# Patient Record
Sex: Female | Born: 1955 | Hispanic: Yes | Marital: Married
Health system: Midwestern US, Academic
[De-identification: ages and names within clinical notes are randomized; demographics above are authoritative.]

---

## 2018-01-30 ENCOUNTER — Encounter: Admit: 2018-01-30 | Discharge: 2018-01-30 | Payer: No Typology Code available for payment source

## 2018-02-04 ENCOUNTER — Encounter: Admit: 2018-02-04 | Discharge: 2018-02-04 | Payer: No Typology Code available for payment source

## 2018-02-04 ENCOUNTER — Ambulatory Visit: Admit: 2018-02-04 | Discharge: 2018-02-04 | Payer: No Typology Code available for payment source

## 2018-02-04 DIAGNOSIS — E113299 Type 2 diabetes mellitus with mild nonproliferative diabetic retinopathy without macular edema, unspecified eye: Principal | ICD-10-CM

## 2018-02-04 MED ORDER — BEVACIZUMAB (#) 1.25MG/0.05ML INTRAVIT SYR
1.25 mg | Freq: Once | INTRAVITREAL | 0 refills | Status: CP
Start: 2018-02-04 — End: ?
  Administered 2018-02-04: 23:00:00 1.25 mg via INTRAVITREAL

## 2018-02-04 MED ORDER — FLUORESCEIN 500 MG/2 ML (25 %) IV SOLN
2 mL | Freq: Once | INTRAVENOUS | 0 refills | Status: CP
Start: 2018-02-04 — End: ?
  Administered 2018-02-04: 22:00:00 2 mL via INTRAVENOUS

## 2018-02-06 ENCOUNTER — Encounter: Admit: 2018-02-06 | Discharge: 2018-02-06 | Payer: No Typology Code available for payment source

## 2018-02-20 ENCOUNTER — Encounter: Admit: 2018-02-20 | Discharge: 2018-02-20 | Payer: No Typology Code available for payment source

## 2018-02-21 ENCOUNTER — Encounter: Admit: 2018-02-21 | Discharge: 2018-02-21 | Payer: No Typology Code available for payment source

## 2018-02-21 ENCOUNTER — Ambulatory Visit: Admit: 2018-02-21 | Discharge: 2018-02-21 | Payer: No Typology Code available for payment source

## 2018-02-21 DIAGNOSIS — E113411 Type 2 diabetes mellitus with severe nonproliferative diabetic retinopathy with macular edema, right eye: ICD-10-CM

## 2018-02-21 DIAGNOSIS — Z794 Long term (current) use of insulin: ICD-10-CM

## 2018-02-21 DIAGNOSIS — Z79899 Other long term (current) drug therapy: ICD-10-CM

## 2018-02-21 DIAGNOSIS — E113592 Type 2 diabetes mellitus with proliferative diabetic retinopathy without macular edema, left eye: Principal | ICD-10-CM

## 2018-02-21 DIAGNOSIS — M1990 Unspecified osteoarthritis, unspecified site: Secondary | ICD-10-CM

## 2018-02-21 DIAGNOSIS — Z7982 Long term (current) use of aspirin: ICD-10-CM

## 2018-02-21 DIAGNOSIS — Z7984 Long term (current) use of oral hypoglycemic drugs: ICD-10-CM

## 2018-02-21 DIAGNOSIS — E119 Type 2 diabetes mellitus without complications: Secondary | ICD-10-CM

## 2018-02-21 MED ORDER — PHENYLEPHRINE HCL 2.5 % OP DROP
1 [drp] | 0 refills | Status: CP
Start: 2018-02-21 — End: ?
  Administered 2018-02-21: 18:00:00 1 [drp]

## 2018-02-21 MED ORDER — TROPICAMIDE 1 % OP DROP
1 [drp] | 0 refills | Status: CP
Start: 2018-02-21 — End: ?
  Administered 2018-02-21: 18:00:00 1 [drp]

## 2018-02-21 MED ORDER — TETRACAINE HCL (PF) 0.5 % OP DROP
0 refills | Status: DC
Start: 2018-02-21 — End: 2018-02-21
  Administered 2018-02-21: 19:00:00 2 [drp] via OPHTHALMIC

## 2018-02-21 MED ORDER — HYPROMELLOSE 2.5 % OP DROP
0 refills | Status: DC
Start: 2018-02-21 — End: 2018-02-21
  Administered 2018-02-21: 19:00:00 1 [drp] via OPHTHALMIC

## 2018-02-21 MED ORDER — CYCLOPENTOLATE 1 % OP DROP
1 [drp] | 0 refills | Status: CP
Start: 2018-02-21 — End: ?
  Administered 2018-02-21: 18:00:00 1 [drp]

## 2018-02-24 ENCOUNTER — Encounter: Admit: 2018-02-24 | Discharge: 2018-02-24 | Payer: No Typology Code available for payment source

## 2018-02-24 DIAGNOSIS — M1990 Unspecified osteoarthritis, unspecified site: Secondary | ICD-10-CM

## 2018-02-24 DIAGNOSIS — E119 Type 2 diabetes mellitus without complications: Secondary | ICD-10-CM

## 2018-02-26 ENCOUNTER — Encounter: Admit: 2018-02-26 | Discharge: 2018-02-26 | Payer: No Typology Code available for payment source

## 2018-03-04 ENCOUNTER — Encounter: Admit: 2018-03-04 | Discharge: 2018-03-04 | Payer: No Typology Code available for payment source

## 2018-03-04 DIAGNOSIS — E133592 Other specified diabetes mellitus with proliferative diabetic retinopathy without macular edema, left eye: Secondary | ICD-10-CM

## 2018-03-05 DIAGNOSIS — E133592 Other specified diabetes mellitus with proliferative diabetic retinopathy without macular edema, left eye: ICD-10-CM

## 2018-03-13 ENCOUNTER — Encounter: Admit: 2018-03-13 | Discharge: 2018-03-13 | Payer: No Typology Code available for payment source

## 2018-03-14 ENCOUNTER — Encounter: Admit: 2018-03-14 | Discharge: 2018-03-14 | Payer: No Typology Code available for payment source

## 2018-03-14 ENCOUNTER — Ambulatory Visit: Admit: 2018-03-14 | Discharge: 2018-03-14 | Payer: No Typology Code available for payment source

## 2018-03-14 DIAGNOSIS — Z9889 Other specified postprocedural states: Secondary | ICD-10-CM

## 2018-03-14 DIAGNOSIS — Z7984 Long term (current) use of oral hypoglycemic drugs: Secondary | ICD-10-CM

## 2018-03-14 DIAGNOSIS — E119 Type 2 diabetes mellitus without complications: Secondary | ICD-10-CM

## 2018-03-14 DIAGNOSIS — M1990 Unspecified osteoarthritis, unspecified site: Secondary | ICD-10-CM

## 2018-03-14 DIAGNOSIS — Z7982 Long term (current) use of aspirin: Secondary | ICD-10-CM

## 2018-03-14 DIAGNOSIS — Z79899 Other long term (current) drug therapy: Secondary | ICD-10-CM

## 2018-03-14 DIAGNOSIS — E785 Hyperlipidemia, unspecified: Secondary | ICD-10-CM

## 2018-03-14 DIAGNOSIS — E113592 Type 2 diabetes mellitus with proliferative diabetic retinopathy without macular edema, left eye: Secondary | ICD-10-CM

## 2018-03-14 DIAGNOSIS — E133592 Other specified diabetes mellitus with proliferative diabetic retinopathy without macular edema, left eye: ICD-10-CM

## 2018-03-14 MED ORDER — CYCLOPENTOLATE 1 % OP DROP
1 [drp] | OPHTHALMIC | 0 refills | Status: DC
Start: 2018-03-14 — End: 2018-03-14
  Administered 2018-03-14: 19:00:00 1 [drp] via OPHTHALMIC

## 2018-03-14 MED ORDER — HYPROMELLOSE 2.5 % OP DROP
0 refills | Status: DC
Start: 2018-03-14 — End: 2018-03-14
  Administered 2018-03-14: 19:00:00 1 [drp] via OPHTHALMIC

## 2018-03-14 MED ORDER — TETRACAINE HCL (PF) 0.5 % OP DROP
0 refills | Status: DC
Start: 2018-03-14 — End: 2018-03-14
  Administered 2018-03-14: 19:00:00 1 [drp] via OPHTHALMIC

## 2018-03-14 MED ORDER — PHENYLEPHRINE HCL 10 % OP DROP
1 [drp] | Freq: Once | OPHTHALMIC | 0 refills | Status: DC
Start: 2018-03-14 — End: 2018-03-14

## 2018-03-14 MED ORDER — BEVACIZUMAB (#) 1.25MG/0.05ML INTRAVIT SYR
1.25 mg | Freq: Once | INTRAVITREAL | 0 refills | Status: CP
Start: 2018-03-14 — End: ?
  Administered 2018-03-14: 21:00:00 1.25 mg via INTRAVITREAL

## 2018-03-14 MED ORDER — TETRACAINE HCL (PF) 0.5 % OP DROP
1 [drp] | OPHTHALMIC | 0 refills | Status: CP
Start: 2018-03-14 — End: ?
  Administered 2018-03-14: 19:00:00 1 [drp] via OPHTHALMIC

## 2018-03-14 MED ORDER — PHENYLEPHRINE HCL 2.5 % OP DROP
1 [drp] | OPHTHALMIC | 0 refills | Status: CP
Start: 2018-03-14 — End: ?
  Administered 2018-03-14: 19:00:00 1 [drp] via OPHTHALMIC

## 2018-03-14 MED ORDER — ACETAMINOPHEN 500 MG PO TAB
1000 mg | Freq: Once | ORAL | 0 refills | Status: CP
Start: 2018-03-14 — End: ?
  Administered 2018-03-14: 19:00:00 1000 mg via ORAL

## 2018-03-14 MED ORDER — TROPICAMIDE 1 % OP DROP
1 [drp] | OPHTHALMIC | 0 refills | Status: CP
Start: 2018-03-14 — End: ?
  Administered 2018-03-14: 19:00:00 1 [drp] via OPHTHALMIC

## 2018-03-14 MED ORDER — PROPARACAINE 0.5 % OP DROP
1 [drp] | OPHTHALMIC | 0 refills | Status: DC
Start: 2018-03-14 — End: 2018-03-14

## 2018-03-17 ENCOUNTER — Encounter: Admit: 2018-03-17 | Discharge: 2018-03-17 | Payer: No Typology Code available for payment source

## 2018-03-17 DIAGNOSIS — E119 Type 2 diabetes mellitus without complications: Secondary | ICD-10-CM

## 2018-03-17 DIAGNOSIS — M1990 Unspecified osteoarthritis, unspecified site: Secondary | ICD-10-CM

## 2018-06-09 ENCOUNTER — Ambulatory Visit: Admit: 2018-06-09 | Discharge: 2018-06-10 | Payer: No Typology Code available for payment source

## 2018-06-09 ENCOUNTER — Encounter: Admit: 2018-06-09 | Discharge: 2018-06-09 | Payer: No Typology Code available for payment source

## 2018-06-09 DIAGNOSIS — E113512 Type 2 diabetes mellitus with proliferative diabetic retinopathy with macular edema, left eye: Principal | ICD-10-CM

## 2018-06-09 MED ORDER — AFLIBERCEPT 2 MG/0.05 ML INTRAVITREAL SOLN
2 mg | Freq: Once | INTRAVITREAL | 0 refills | Status: CP
Start: 2018-06-09 — End: ?
  Administered 2018-06-09: 17:00:00 2 mg via INTRAVITREAL

## 2018-06-09 NOTE — Progress Notes
There is no height or weight on file to calculate BMI.       OCT:  OD: retinal cystic spaces  OS: ???retinal cystic spaces, +ve srf, separated hyaloid     Optos:  OD: H/MAs, macular exudates  OS: H/MAs, macular exudates, prp laser marks      I discussed diagnosis and plan for intravitreal injection with patient. Risks, benefits and alternatives were discussed with patient. Risk of infection, and signs and symptoms of infection discussed at length with patient. Discussed fluctuating and deteriorating vision over the course of treatment. Patient elects to proceed with injection, informed consent was obtained and all questions were answered.     Injection procedure:    Site:     OD     OS  x   Posterior Sub-Tenon     Subconj     Intravitreous       Pre-injection drops:   Tetracaine 0.5% drops  x    5% Betadine  x    Betadine swab to lids  x    Speculum used     Other        Injection medication:    Concentration  Volume (in mL)    Aflibercept (Eylea)  2 mg / 0.05 mL  x   Ranibizumab (Lucentis)  0.3 mg / 0.05 mL     Ranibizumab (Lucentis)  0.5 mg / 0.05 mL     Bevacizumab (Avastin)  1.25 mg / 0.05 mL     Triamcinolone (Kenalog)  40 mg / mL     Triamcinolone (Triesence)  40 mg / mL     Vancomycin  1 mg / 100 microLiters     Ceftazidime  2 mg / 100 microLiters     Kenalog Triamcinolone 40mg /ml    Ozurdex Dexamethason 0.7mg  implant      Lot #  Expiration date:     Injection Needle:   27 Gauge Needle     30 Gauge Needle  x   Needle supplied with medication       Post-operative examination:   Central retinal artery perfused by vision check X   Intraocular pressure checked by tonopen and found to be ___ mmHg     Intraocular pressure checked by applanation and found to be ___ mmHg         I personally performed the Injection           Assessment and Plan:         Referred in by Dr. Cheron Every at [facility]  ???  PMH:   DM2 for 30 years, taking metformin and another oral medication, no insulin,   Joint problem?  HLD  ???

## 2018-08-07 ENCOUNTER — Encounter: Admit: 2018-08-07 | Discharge: 2018-08-07

## 2018-08-07 DIAGNOSIS — E119 Type 2 diabetes mellitus without complications: Secondary | ICD-10-CM

## 2018-08-07 DIAGNOSIS — E113512 Type 2 diabetes mellitus with proliferative diabetic retinopathy with macular edema, left eye: Secondary | ICD-10-CM

## 2018-08-07 DIAGNOSIS — M1990 Unspecified osteoarthritis, unspecified site: Secondary | ICD-10-CM

## 2018-08-07 MED ORDER — AFLIBERCEPT 2 MG/0.05 ML INTRAVITREAL SOLN
2 mg | Freq: Once | INTRAVITREAL | 0 refills | Status: CP
Start: 2018-08-07 — End: ?
  Administered 2018-08-07: 22:00:00 2 mg via INTRAVITREAL

## 2018-08-08 ENCOUNTER — Ambulatory Visit: Admit: 2018-08-07 | Discharge: 2018-08-08

## 2018-08-08 DIAGNOSIS — E113491 Type 2 diabetes mellitus with severe nonproliferative diabetic retinopathy without macular edema, right eye: Secondary | ICD-10-CM

## 2018-08-08 DIAGNOSIS — Z961 Presence of intraocular lens: Secondary | ICD-10-CM

## 2018-11-11 ENCOUNTER — Ambulatory Visit: Admit: 2018-11-11 | Discharge: 2018-11-12 | Payer: No Typology Code available for payment source

## 2018-11-11 ENCOUNTER — Encounter: Admit: 2018-11-11 | Discharge: 2018-11-11 | Payer: No Typology Code available for payment source

## 2018-11-11 MED ORDER — AFLIBERCEPT 2 MG/0.05 ML INTRAVITREAL SOLN
2 mg | Freq: Once | INTRAVITREAL | 0 refills | Status: CP
Start: 2018-11-11 — End: ?
  Administered 2018-11-11: 14:00:00 2 mg via INTRAVITREAL

## 2018-11-11 NOTE — Progress Notes
Body mass index is 26.52 kg/m?.        OCT:  OD: retinal cystic?spaces  OS: increased?retinal cystic?spaces,?separated hyaloid?      I discussed diagnosis and plan for intravitreal injection with patient. Risks, benefits and alternatives were discussed with patient. Risk of infection, and signs and symptoms of infection discussed at length with patient. Discussed fluctuating and deteriorating vision over the course of treatment. Patient elects to proceed with injection, informed consent was obtained and all questions were answered.     Injection procedure:    Site:     OD     OS  x   Posterior Sub-Tenon     Subconj     Intravitreous       Pre-injection drops:   Tetracaine 0.5% drops  x    5% Betadine  x    Betadine swab to lids  x    Speculum used     Other        Injection medication:    Concentration  Volume (in mL)    Aflibercept (Eylea)  2 mg / 0.05 mL  x   Ranibizumab (Lucentis)  0.3 mg / 0.05 mL     Ranibizumab (Lucentis)  0.5 mg / 0.05 mL     Bevacizumab (Avastin)  1.25 mg / 0.05 mL     Triamcinolone (Kenalog)  40 mg / mL     Triamcinolone (Triesence)  40 mg / mL     Vancomycin  1 mg / 100 microLiters     Ceftazidime  2 mg / 100 microLiters     Kenalog Triamcinolone 40mg /ml    Ozurdex Dexamethason 0.7mg  implant      Lot #  Expiration date:     Injection Needle:   27 Gauge Needle     30 Gauge Needle  x   Needle supplied with medication       Post-operative examination:   Central retinal artery perfused by vision check X   Intraocular pressure checked by tonopen and found to be ___ mmHg     Intraocular pressure checked by applanation and found to be ___ mmHg         I personally performed the Injection           Assessment and Plan:         Referred in by Dr. Cheron Every at [facility] [facility]  ?  PMH:   DM2?for 30 years, taking metformin and another oral medication, no insulin,   Joint problem?  HLD  ?  Medications: Gabapentin, metformin, atorvastatin, aspirin  ?  1.?Diabetic retinopathy  OD: severe ?NPDR OS: PDR + DME. S/p prp, room for fill in laser. Plan for ivE today  Stress importance of glycemic and systemic control.  ?  2. PCIOL?OU  Monitor?    3- Dry eye syndrome OU  AT and warm compresses  ?  Signs and symptoms of retinal tears and retinal detachment were reviewed in details with the patient. Patient was instructed to immediately present for evaluation or seek medical help if increased flashes, increased floaters, any decrease in vision, or curtain in field of vision.  ?  F/u?1?m or sooner prn   Oct, optos

## 2018-12-11 ENCOUNTER — Encounter: Admit: 2018-12-11 | Discharge: 2018-12-11 | Payer: No Typology Code available for payment source

## 2018-12-11 ENCOUNTER — Ambulatory Visit: Admit: 2018-12-11 | Discharge: 2018-12-12 | Payer: No Typology Code available for payment source

## 2018-12-11 DIAGNOSIS — E113512 Type 2 diabetes mellitus with proliferative diabetic retinopathy with macular edema, left eye: Principal | ICD-10-CM

## 2018-12-11 NOTE — Progress Notes
OCT:  OD: retinal cystic?spaces but with relatively preserved foveal contour, overall retinal thinning  OS: improvement in?retinal cystic?spaces, ERM      Optos  OD: DBH, exudates, CWS in macula, MAs, retina all ON, no obvious NV  OS: DBH, exudates, MAs in macula, retina all ON with PRP, no obvious Nv      I discussed diagnosis and plan for intravitreal injection with patient. Risks, benefits and alternatives were discussed with patient. Risk of infection, and signs and symptoms of infection discussed at length with patient. Discussed fluctuating and deteriorating vision over the course of treatment. Patient elects to proceed with injection, informed consent was obtained and all questions were answered.     Injection procedure:    Site:     OD     OS  x   Posterior Sub-Tenon     Subconj     Intravitreous       Pre-injection drops:   Tetracaine 0.5% drops  x    5% Betadine  x    Betadine swab to lids  x    Speculum used     Other        Injection medication:    Concentration  Volume (in mL)    Aflibercept (Eylea)  2 mg / 0.05 mL  x   Ranibizumab (Lucentis)  0.3 mg / 0.05 mL     Ranibizumab (Lucentis)  0.5 mg / 0.05 mL     Bevacizumab (Avastin)  1.25 mg / 0.05 mL     Triamcinolone (Kenalog)  40 mg / mL     Triamcinolone (Triesence)  40 mg / mL     Vancomycin  1 mg / 100 microLiters     Ceftazidime  2 mg / 100 microLiters     Kenalog Triamcinolone 40mg /ml    Ozurdex Dexamethason 0.7mg  implant      Lot #  Expiration date:     Injection Needle:   27 Gauge Needle     30 Gauge Needle  x   Needle supplied with medication       Post-operative examination:   Central retinal artery perfused by vision check X   Intraocular pressure checked by tonopen and found to be ___ mmHg     Intraocular pressure checked by applanation and found to be ___ mmHg              Assessment and Plan:         Referred in by Dr. Cheron Every at [place] [facility]  ?  PMH:   DM2?for 30 years, taking metformin and another oral medication, no insulin,   Joint problem?  HLD  ?  Medications: Gabapentin, metformin, atorvastatin, aspirin  ?  1.?Diabetic retinopathy  OD: severe ?NPDR   OS: PDR + DME. S/p prp  Plan for ivE today  Stress importance of glycemic and systemic control.  ?  2. PCIOL?OU  Monitor?    3- Dry eye syndrome OU  AT and warm compresses  ?  Signs and symptoms of retinal tears and retinal detachment were reviewed in details with the patient. Patient was instructed to immediately present for evaluation or seek medical help if increased flashes, increased floaters, any decrease in vision, or curtain in field of vision.  ?  F/u?3-4?m or sooner prn   Oct, optos      Wonda Amis, MD  Ophthalmology Resident PGY-4  [phone]

## 2019-03-19 ENCOUNTER — Encounter: Admit: 2019-03-19 | Discharge: 2019-03-19 | Payer: No Typology Code available for payment source

## 2019-03-19 ENCOUNTER — Ambulatory Visit: Admit: 2019-03-19 | Discharge: 2019-03-20 | Payer: No Typology Code available for payment source

## 2019-03-19 DIAGNOSIS — M1990 Unspecified osteoarthritis, unspecified site: Secondary | ICD-10-CM

## 2019-03-19 DIAGNOSIS — H04129 Dry eye syndrome of unspecified lacrimal gland: Secondary | ICD-10-CM

## 2019-03-19 DIAGNOSIS — Z961 Presence of intraocular lens: Secondary | ICD-10-CM

## 2019-03-19 DIAGNOSIS — E113512 Type 2 diabetes mellitus with proliferative diabetic retinopathy with macular edema, left eye: Secondary | ICD-10-CM

## 2019-03-19 DIAGNOSIS — E119 Type 2 diabetes mellitus without complications: Secondary | ICD-10-CM

## 2019-03-19 MED ORDER — AFLIBERCEPT 2 MG/0.05 ML INTRAVITREAL SOLN GROUP
2 mg | Freq: Once | INTRAVITREAL | 0 refills | Status: CP
Start: 2019-03-19 — End: ?
  Administered 2019-03-19: 23:00:00 2 mg via INTRAVITREAL

## 2019-03-19 NOTE — Progress Notes
There is no height or weight on file to calculate BMI.        OCT:  OD: retinal cystic?spaces but with relatively preserved foveal contour, overall retinal thinning  OS: increased retinal thickness,  ERM      I discussed diagnosis and plan for intravitreal injection with patient. Risks, benefits and alternatives were discussed with patient. Risk of infection, and signs and symptoms of infection discussed at length with patient. Discussed fluctuating and deteriorating vision over the course of treatment. Patient elects to proceed with injection, informed consent was obtained and all questions were answered.     Injection procedure:    Site:     OD     OS  x   Posterior Sub-Tenon     Subconj     Intravitreous       Pre-injection drops:   Tetracaine 0.5% drops  x    5% Betadine  x    Betadine swab to lids  x    Speculum used     Other        Injection medication:    Concentration  Volume (in mL)    Aflibercept (Eylea)  2 mg / 0.05 mL  x   Ranibizumab (Lucentis)  0.3 mg / 0.05 mL     Ranibizumab (Lucentis)  0.5 mg / 0.05 mL     Bevacizumab (Avastin)  1.25 mg / 0.05 mL     Triamcinolone (Kenalog)  40 mg / mL     Triamcinolone (Triesence)  40 mg / mL     Vancomycin  1 mg / 100 microLiters     Ceftazidime  2 mg / 100 microLiters     Kenalog Triamcinolone 40mg /ml    Ozurdex Dexamethason 0.7mg  implant      Lot #  Expiration date:     Injection Needle:   27 Gauge Needle     30 Gauge Needle  x   Needle supplied with medication       Post-operative examination:   Central retinal artery perfused by vision check X   Intraocular pressure checked by tonopen and found to be ___ mmHg     Intraocular pressure checked by applanation and found to be ___ mmHg         I personally performed the Injection         Assessment and Plan:       DFE    Referred in by Dr. Cheron Every at [place] [facility]  ?  PMH:   DM2?for 30 years, taking metformin and another oral medication, no insulin,   Joint problem?  HLD  ? Medications: Gabapentin, metformin, atorvastatin, aspirin  ?  1.?Diabetic retinopathy  OD: severe ?NPDR   OS: PDR?+ DME. S/p prp  Plan for ivE today  Stress importance of glycemic and systemic control.  ?  2. PCIOL?OU  Monitor?  ?  3- Dry eye syndrome OU  AT and warm compresses  ?  Signs and symptoms of retinal tears and retinal detachment were reviewed in details with the patient. Patient was instructed to immediately present for evaluation or seek medical help if increased flashes, increased floaters, any decrease in vision, or curtain in field of vision.  ?  F/u?1?m or sooner prn   Oct, optos  ?

## 2019-03-20 DIAGNOSIS — E113411 Type 2 diabetes mellitus with severe nonproliferative diabetic retinopathy with macular edema, right eye: Secondary | ICD-10-CM

## 2019-04-20 ENCOUNTER — Ambulatory Visit: Admit: 2019-04-20 | Discharge: 2019-04-20 | Payer: No Typology Code available for payment source

## 2019-04-20 ENCOUNTER — Encounter: Admit: 2019-04-20 | Discharge: 2019-04-20 | Payer: No Typology Code available for payment source

## 2019-04-20 DIAGNOSIS — M1990 Unspecified osteoarthritis, unspecified site: Secondary | ICD-10-CM

## 2019-04-20 DIAGNOSIS — E119 Type 2 diabetes mellitus without complications: Secondary | ICD-10-CM

## 2019-04-20 MED ORDER — AFLIBERCEPT 2 MG/0.05 ML INTRAVITREAL SOLN GROUP
2 mg | Freq: Once | INTRAVITREAL | 0 refills | Status: CP
Start: 2019-04-20 — End: ?
  Administered 2019-04-20: 22:00:00 2 mg via INTRAVITREAL

## 2019-05-21 ENCOUNTER — Ambulatory Visit: Admit: 2019-05-21 | Discharge: 2019-05-22 | Payer: No Typology Code available for payment source

## 2019-05-21 ENCOUNTER — Encounter: Admit: 2019-05-21 | Discharge: 2019-05-21 | Payer: No Typology Code available for payment source

## 2019-05-21 DIAGNOSIS — E119 Type 2 diabetes mellitus without complications: Secondary | ICD-10-CM

## 2019-05-21 DIAGNOSIS — M1990 Unspecified osteoarthritis, unspecified site: Secondary | ICD-10-CM

## 2019-05-21 DIAGNOSIS — E113512 Type 2 diabetes mellitus with proliferative diabetic retinopathy with macular edema, left eye: Principal | ICD-10-CM

## 2019-05-21 MED ORDER — BEVACIZUMAB 1.25MG/0.05ML INTRAVIT SYR
1.25 mg | Freq: Once | INTRAVITREAL | 0 refills | Status: CP
Start: 2019-05-21 — End: ?
  Administered 2019-05-21: 22:00:00 1.25 mg via INTRAVITREAL

## 2019-05-21 NOTE — Progress Notes
There is no height or weight on file to calculate BMI.        OCT:  OD: stable retinal cystic?spaces   OS: decreasing retinal thickness,??ERM      I discussed diagnosis and plan for intravitreal injection with patient. Risks, benefits and alternatives were discussed with patient. Risk of infection, and signs and symptoms of infection discussed at length with patient. Discussed fluctuating and deteriorating vision over the course of treatment. Patient elects to proceed with injection, informed consent was obtained and all questions were answered.     Injection procedure:    Site:     OD     OS  x   Posterior Sub-Tenon     Subconj     Intravitreous       Pre-injection drops:   Tetracaine 0.5% drops  x    5% Betadine  x    Betadine swab to lids  x    Speculum used     Other        Injection medication:    Concentration  Volume (in mL)    Aflibercept (Eylea)  2 mg / 0.05 mL     Ranibizumab (Lucentis)  0.3 mg / 0.05 mL     Ranibizumab (Lucentis)  0.5 mg / 0.05 mL     Bevacizumab (Avastin)  1.25 mg / 0.05 mL  x   Triamcinolone (Kenalog)  40 mg / mL     Triamcinolone (Triesence)  40 mg / mL     Vancomycin  1 mg / 100 microLiters     Ceftazidime  2 mg / 100 microLiters     Kenalog Triamcinolone 40mg /ml    Ozurdex Dexamethason 0.7mg  implant      Lot #  Expiration date:     Injection Needle:   27 Gauge Needle     30 Gauge Needle  x   Needle supplied with medication       Post-operative examination:   Central retinal artery perfused by vision check X   Intraocular pressure checked by tonopen and found to be ___ mmHg     Intraocular pressure checked by applanation and found to be ___ mmHg         I personally performed the Injection           Assessment and Plan:         Referred in by Dr. Cheron Every at [place] [facility]  ?  PMH:   DM2?for 30 years, taking metformin and another oral medication, no insulin,   Joint problem?  HLD  ?  Medications: Gabapentin, metformin, atorvastatin, aspirin  ?  1.?Diabetic retinopathy  OD: severe ?NPDR   OS: PDR?+ DME. S/p prp  Plan for ivA today (patient reports having issues with billing her last ivE injection) (plan for Good Days paperwork today as backup)  Stress importance of glycemic and systemic control.  ?  2. PCIOL?OU  Monitor?  ?  3- Dry eye syndrome OU  AT and warm compresses  ?  Signs and symptoms of retinal tears and retinal detachment were reviewed in details with the patient. Patient was instructed to immediately present for evaluation or seek medical help if increased flashes, increased floaters, any decrease in vision, or curtain in field of vision.  ?  F/u?4-6?m or sooner prn   Oct, optos  ?

## 2019-05-21 NOTE — Telephone Encounter
Notified the pt that we had cancellations and if she'd like to come in early today for her appointment to head to the clinic.

## 2019-11-03 ENCOUNTER — Encounter: Admit: 2019-11-03 | Discharge: 2019-11-03 | Payer: No Typology Code available for payment source

## 2019-11-03 ENCOUNTER — Ambulatory Visit: Admit: 2019-11-03 | Discharge: 2019-11-03 | Payer: No Typology Code available for payment source

## 2019-11-03 DIAGNOSIS — H04129 Dry eye syndrome of unspecified lacrimal gland: Secondary | ICD-10-CM

## 2019-11-03 DIAGNOSIS — Z961 Presence of intraocular lens: Secondary | ICD-10-CM

## 2019-11-03 DIAGNOSIS — E113512 Type 2 diabetes mellitus with proliferative diabetic retinopathy with macular edema, left eye: Secondary | ICD-10-CM

## 2019-11-03 DIAGNOSIS — M1990 Unspecified osteoarthritis, unspecified site: Secondary | ICD-10-CM

## 2019-11-03 DIAGNOSIS — E113411 Type 2 diabetes mellitus with severe nonproliferative diabetic retinopathy with macular edema, right eye: Secondary | ICD-10-CM

## 2019-11-03 DIAGNOSIS — H3562 Retinal hemorrhage, left eye: Secondary | ICD-10-CM

## 2019-11-03 DIAGNOSIS — E119 Type 2 diabetes mellitus without complications: Secondary | ICD-10-CM

## 2019-11-03 MED ORDER — BEVACIZUMAB 1.25MG/0.05ML INTRAVIT SYR
1.25 mg | Freq: Once | INTRAVITREAL | 0 refills | Status: CP
Start: 2019-11-03 — End: ?
  Administered 2019-11-03: 21:00:00 1.25 mg via INTRAVITREAL

## 2019-11-09 ENCOUNTER — Encounter: Admit: 2019-11-09 | Discharge: 2019-11-09 | Payer: No Typology Code available for payment source

## 2019-11-09 NOTE — Telephone Encounter
Nurse tried to call pt regarding laser procedure. Nurse called interpreter. Provided 2 phone numbers for interpreter to call. Interpreter stated first number was out of service and could not be reached. The second number was an invalid number. Pt cannot be reached.

## 2020-02-24 ENCOUNTER — Encounter: Admit: 2020-02-24 | Discharge: 2020-02-24 | Payer: No Typology Code available for payment source

## 2020-02-24 NOTE — Telephone Encounter
Pt's daughter jennifer called to set up laser -adv numbers on file were not in service    Call [place] at [phone] or Harriett Sine at [phone]

## 2020-08-29 ENCOUNTER — Encounter: Admit: 2020-08-29 | Discharge: 2020-08-29 | Payer: No Typology Code available for payment source

## 2020-08-29 NOTE — Telephone Encounter
Received fax about Eylea copay program, Patient was approved eylea copay assistance from 05/12/20-05/11/21. (ID: [id number], Group # [id number], RXBin # [id number])

## 2020-09-06 ENCOUNTER — Ambulatory Visit: Admit: 2020-09-06 | Discharge: 2020-09-06 | Payer: No Typology Code available for payment source

## 2020-09-06 ENCOUNTER — Encounter: Admit: 2020-09-06 | Discharge: 2020-09-06 | Payer: No Typology Code available for payment source

## 2020-09-06 DIAGNOSIS — E113512 Type 2 diabetes mellitus with proliferative diabetic retinopathy with macular edema, left eye: Secondary | ICD-10-CM

## 2020-09-06 DIAGNOSIS — M1990 Unspecified osteoarthritis, unspecified site: Secondary | ICD-10-CM

## 2020-09-06 DIAGNOSIS — H3562 Retinal hemorrhage, left eye: Secondary | ICD-10-CM

## 2020-09-06 DIAGNOSIS — E119 Type 2 diabetes mellitus without complications: Secondary | ICD-10-CM

## 2020-09-06 MED ORDER — BEVACIZUMAB 1.25MG/0.05ML INTRAVIT SYR
1.25 mg | Freq: Once | 0 refills | Status: CP
Start: 2020-09-06 — End: ?
  Administered 2020-09-06: 21:00:00 1.25 mg via INTRAVITREAL

## 2020-09-06 NOTE — Progress Notes
Body mass index is 24.89 kg/m?Marland Kitchen    Ultra widefield pseudo-color fundus photo  OD: clear vitreous, arterial attenuation, scattered H/MAs, no NVD  OS: clear vitreous, arterial attenuation, scattered H/MAs, possible NVD, peripheral laser scars    OCT macula  OD: ERM,recurrent new macular edema, improvement in cystoid macular edema, partial PVD  OS: stable ERM with loss of foveal contour, increased DME, partial PVD       I discussed diagnosis and plan for intravitreal injection with patient. Risks, benefits and alternatives were discussed with patient. Risk of infection, and signs and symptoms of infection discussed at length with patient. Discussed fluctuating and deteriorating vision over the course of treatment. Patient elects to proceed with injection, informed consent was obtained and all questions were answered.     Injection procedure:    Site:     OD  x   OS  x   Posterior Sub-Tenon     Subconj     Intravitreous       Pre-injection drops:   Tetracaine 0.5% drops  x    5% Betadine  x    Betadine swab to lids  x    Speculum used     Other        Injection medication:    Concentration  Volume (in mL)    Aflibercept (Eylea)  2 mg / 0.05 mL     Ranibizumab (Lucentis)  0.3 mg / 0.05 mL     Ranibizumab (Lucentis)  0.5 mg / 0.05 mL     Bevacizumab (Avastin)  1.25 mg / 0.05 mL  xx   Triamcinolone (Kenalog)  40 mg / mL     Triamcinolone (Triesence)  40 mg / mL     Vancomycin  1 mg / 100 microLiters     Ceftazidime  2 mg / 100 microLiters     Kenalog Triamcinolone 40mg /ml    Ozurdex Dexamethason 0.7mg  implant      Lot #  Expiration date:     Injection Needle:   27 Gauge Needle     30 Gauge Needle  xx   Needle supplied with medication       Post-operative examination:   Central retinal artery perfused by vision check XX   Intraocular pressure checked by tonopen and found to be ___ mmHg     Intraocular pressure checked by applanation and found to be ___ mmHg         I personally performed the Injection            Assessment and Plan:    Referred in by Dr. Cheron Every at [place] [facility]  ?  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 S/p prp  Stress importance of glycemic and systemic control.  Recommend ivA?OU today (provided Good Days paperwork today as backup)     2. Vitreous hemorrhage OS  Resolved today  Subhyaloid heme today not involving fovea  Will plan on fill PRP OS (will attempt today if possible)      3. PCIOL?OU  4. PCO OU  Possibly visually significant  Monitor for now    5. Dry eye syndrome OU  AT and warm compresses    Signs and symptoms of retinal tears and retinal detachment were reviewed in details with the patient. Patient was  instructed to immediately present for evaluation or seek medical help if increased flashes, increased floaters, any decrease in vision, or curtain in field of vision.  ?  ?  Alison Stalling, MD  Ophthalmology Resident - PGY-2

## 2020-09-16 ENCOUNTER — Encounter: Admit: 2020-09-16 | Discharge: 2020-09-16 | Payer: No Typology Code available for payment source

## 2020-09-16 DIAGNOSIS — M1990 Unspecified osteoarthritis, unspecified site: Secondary | ICD-10-CM

## 2020-09-16 DIAGNOSIS — E119 Type 2 diabetes mellitus without complications: Secondary | ICD-10-CM

## 2020-09-16 MED ADMIN — TROPICAMIDE 1 % OP DROP [8250]: 1 [drp] | OPHTHALMIC | @ 17:00:00 | Stop: 2020-09-16 | NDC 61314035501

## 2020-09-16 MED ADMIN — ACETAMINOPHEN 500 MG PO TAB [102]: 1000 mg | ORAL | @ 18:00:00 | Stop: 2020-09-16 | NDC 00904673061

## 2020-09-16 MED ADMIN — CYCLOPENTOLATE 1 % OP DROP [2025]: 1 [drp] | OPHTHALMIC | @ 17:00:00 | Stop: 2020-09-16 | NDC 61314039601

## 2020-09-16 MED ADMIN — TETRACAINE HCL (PF) 0.5 % OP DROP [305966]: 1 [drp] | OPHTHALMIC | @ 17:00:00 | Stop: 2020-09-16 | NDC 00065074114

## 2020-09-16 MED ADMIN — PHENYLEPHRINE HCL 2.5 % OP DROP [6246]: 1 [drp] | OPHTHALMIC | @ 17:00:00 | Stop: 2020-09-16 | NDC 17478020102

## 2020-09-18 ENCOUNTER — Encounter: Admit: 2020-09-18 | Discharge: 2020-09-18 | Payer: No Typology Code available for payment source

## 2020-09-18 DIAGNOSIS — M1990 Unspecified osteoarthritis, unspecified site: Secondary | ICD-10-CM

## 2020-09-18 DIAGNOSIS — E119 Type 2 diabetes mellitus without complications: Secondary | ICD-10-CM

## 2020-10-07 ENCOUNTER — Encounter: Admit: 2020-10-07 | Discharge: 2020-10-07 | Payer: No Typology Code available for payment source

## 2020-10-07 ENCOUNTER — Ambulatory Visit: Admit: 2020-10-07 | Discharge: 2020-10-07 | Payer: No Typology Code available for payment source

## 2020-10-07 DIAGNOSIS — E119 Type 2 diabetes mellitus without complications: Secondary | ICD-10-CM

## 2020-10-07 DIAGNOSIS — M1990 Unspecified osteoarthritis, unspecified site: Secondary | ICD-10-CM

## 2020-10-07 DIAGNOSIS — E113512 Type 2 diabetes mellitus with proliferative diabetic retinopathy with macular edema, left eye: Principal | ICD-10-CM

## 2020-10-07 MED ORDER — BEVACIZUMAB 1.25MG/0.05ML INTRAVIT SYR
1.25 mg | Freq: Once | 0 refills | Status: CP
Start: 2020-10-07 — End: ?
  Administered 2020-10-07: 21:00:00 1.25 mg

## 2020-10-07 MED ORDER — BEVACIZUMAB 1.25MG/0.05ML INTRAVIT SYR
1.25 mg | Freq: Once | 0 refills | Status: CP
Start: 2020-10-07 — End: ?

## 2020-10-07 NOTE — Progress Notes
Body mass index is 24.89 kg/m?.        OCT macula  OD: ERM, decreased non center edema, improvement in cystoid macular edema, partial PVD  OS: ERM with loss of foveal contour, decreased  DME, partial PVD      I discussed diagnosis and plan for intravitreal injection with patient. Risks, benefits and alternatives were discussed with patient. Risk of infection, and signs and symptoms of infection discussed at length with patient. Discussed fluctuating and deteriorating vision over the course of treatment. Patient elects to proceed with injection, informed consent was obtained and all questions were answered.     Injection procedure:    Site:     OD     OS  x   Posterior Sub-Tenon     Subconj     Intravitreous       Pre-injection drops:   Tetracaine 0.5% drops  x    5% Betadine  x    Betadine swab to lids  x    Speculum used     Other        Injection medication:    Concentration  Volume (in mL)    Aflibercept (Eylea)  2 mg / 0.05 mL     Ranibizumab (Lucentis)  0.3 mg / 0.05 mL     Ranibizumab (Lucentis)  0.5 mg / 0.05 mL     Bevacizumab (Avastin)  1.25 mg / 0.05 mL  x   Triamcinolone (Kenalog)  40 mg / mL     Triamcinolone (Triesence)  40 mg / mL     Vancomycin  1 mg / 100 microLiters     Ceftazidime  2 mg / 100 microLiters     Kenalog Triamcinolone 40mg /ml    Ozurdex Dexamethason 0.7mg  implant      Lot #  Expiration date:     Injection Needle:   27 Gauge Needle     30 Gauge Needle  x   Needle supplied with medication       Post-operative examination:   Central retinal artery perfused by vision check X   Intraocular pressure checked by tonopen and found to be ___ mmHg     Intraocular pressure checked by applanation and found to be ___ mmHg         I personally performed the Injection           Assessment and Plan:         Referred in by Dr. Cheron Every at [facility]  ?  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  HLD  ?  Medications: Gabapentin, metformin, atorvastatin, aspirin  ?  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 S/p prp  Stress importance of glycemic and systemic control.  Recommend ivA?OS today (provided?Good Days paperwork today as backup)   ?  2. Vitreous hemorrhage OS  Resolved today  Subhyaloid heme today not involving fovea    ?  3. PCIOL?OU  4. PCO OU  Possibly visually significant  Monitor for now  ?  5. Dry eye syndrome OU  AT and warm compresses  ?

## 2020-10-08 ENCOUNTER — Encounter: Admit: 2020-10-08 | Discharge: 2020-10-08 | Payer: No Typology Code available for payment source

## 2020-10-08 MED ORDER — HYDROCORTISONE 1 % TP CREA
Freq: Every day | TOPICAL | 0 refills | 30.00000 days | Status: AC | PRN
Start: 2020-10-08 — End: ?

## 2020-10-08 NOTE — Telephone Encounter
Ophthalmology Progress Note - PGY2    S: Patient underwent intravitreal injection of Avastin OS for PDR + DME yesterday 8/19. Starting last night, she reports that her left eye is red and painful with increased tearing. She noted some thicker discharge from the eye this morning. She feels warm but did not check her temperature at home. No vision changes or new floaters.     She also developed an itchy raised rash on both cheeks starting last night which has worsened and spreading down to her neck. Says she got the same rash after cataract surgery. Says she was outside last evening and may have been near poison ivy.     O:    Near VA:    cc:  OD: 20/60 PH 20/40   OS: 20/40 PH 20/30  Pupils: 4 mm/4 mm, 4+/4+ reactive, No RAPD  Tonopen: 18 OD, 13 OS  CVF: Full to CF OU  Motility: Full OU    External: Erythematous raised papules on bilateral cheeks  Adnexa: MGD OU, erythematous raised rash on L eyelid  Conjunctiva: White/Quiet OD, small SCH inf temp OS   Cornea: Clear OD, trace inf PEE OS  A/C: Deep/Clear OU, no cell or flare, no hypopyon   Iris: Round/Flat OU   Lens: PCIOL OU, PCO OU     Dilated Fundus Exam:   (Dilated with 2.5% Phenylephrine and 1% Tropicamide OU.  Effects last 4-6 hours)  Optic Nerve: 0.3 p/f/s OU  Macula: DBH, hard exudates, MA OU   Vessels: Attenuated OU  Vitreous: No stranding or heme OU, clear view without haze   Periphery: Limited view due to patient cooperation    Labs/Imaging:  No labs or imaging.    A/P:  1.  Surface irritation from betadine after recent intravitreal avastin OS  - No signs of endophthalmitis on exam- no cell/flare, hypopyon, vitreous haze  - Could be due to higher % betadine used during recent injection   - PFAT QID OU   - return precautions provided     2. Contact dermatitis   - Unclear etiology but could be from betadine contact on skin vs gloves used during recent injection vs poison ivy exposure   - Start hydrocortisone cream 1% to lesions     3. Diabetic retinopathy  - Follows with Dr. Elpidio Anis   - OD: severe?NPDR?with DME  - OS: PDR?+ DME- s/p prp and intravitreal avastin     4. Pseudophakia OU  - PCO OU   - continue to monitor     Patient discussed with Dr. Prince Solian (PGY4), to be discussed with Dr. Wilma Flavin, Attending Physician.    If you have any questions, do not hesitate to contact us through the ophthalmology consult pager at [phone].    Aquilla Solian, MD  Ophthalmology PGY-2  Available on [facility] Connect  Pager [phone]    [facility]  [address] [place], [place] [address]  Ph: [phone]

## 2020-11-07 ENCOUNTER — Ambulatory Visit: Admit: 2020-11-07 | Discharge: 2020-11-07 | Payer: No Typology Code available for payment source

## 2020-11-07 ENCOUNTER — Encounter: Admit: 2020-11-07 | Discharge: 2020-11-07 | Payer: No Typology Code available for payment source

## 2020-11-07 DIAGNOSIS — E113411 Type 2 diabetes mellitus with severe nonproliferative diabetic retinopathy with macular edema, right eye: Secondary | ICD-10-CM

## 2020-11-07 DIAGNOSIS — E113512 Type 2 diabetes mellitus with proliferative diabetic retinopathy with macular edema, left eye: Principal | ICD-10-CM

## 2020-11-07 MED ORDER — BEVACIZUMAB 1.25MG/0.05ML INTRAVIT SYR
1.25 mg | Freq: Once | 0 refills | Status: CP
Start: 2020-11-07 — End: ?
  Administered 2020-11-07: 20:00:00 1.25 mg

## 2020-11-07 NOTE — Progress Notes
Body mass index is 24.89 kg/m?.    OCT macula  OD:?ERM, decreased non center edema, improvement in cystoid macular edema, partial PVD  OS:?ERM with loss of foveal contour,?DME nasal fovea, partial PVD  ?      I discussed diagnosis and plan for intravitreal injection with patient. Risks, benefits and alternatives were discussed with patient. Risk of infection, and signs and symptoms of infection discussed at length with patient. Discussed fluctuating and deteriorating vision over the course of treatment. Patient elects to proceed with injection, informed consent was obtained and all questions were answered.     Injection procedure:    Site:     OD  x   OS  x   Posterior Sub-Tenon     Subconj     Intravitreous       Pre-injection drops:   Tetracaine 0.5% drops  x    5% Betadine  x    Betadine swab to lids  x    Speculum used     Other        Injection medication:    Concentration  Volume (in mL)    Aflibercept (Eylea)  2 mg / 0.05 mL     Ranibizumab (Lucentis)  0.3 mg / 0.05 mL     Ranibizumab (Lucentis)  0.5 mg / 0.05 mL     Bevacizumab (Avastin)  1.25 mg / 0.05 mL  xx   Triamcinolone (Kenalog)  40 mg / mL     Triamcinolone (Triesence)  40 mg / mL     Vancomycin  1 mg / 100 microLiters     Ceftazidime  2 mg / 100 microLiters     Kenalog Triamcinolone 40mg /ml    Ozurdex Dexamethason 0.7mg  implant      Lot #  Expiration date:     Injection Needle:   27 Gauge Needle     30 Gauge Needle  xx   Needle supplied with medication       Post-operative examination:   Central retinal artery perfused by vision check Xx   Intraocular pressure checked by tonopen and found to be ___ mmHg     Intraocular pressure checked by applanation and found to be ___ mmHg         I personally performed the Injection             Assessment and Plan:           ?  Referred in by Dr. Cheron Every at [facility]  ?  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  HLD  ?  Medications: Gabapentin, metformin, atorvastatin, aspirin  ?  1.?Diabetic retinopathy  OD: severe?NPDR?now with DME  OS: PDR?+ DME. S/p prp  Stress importance of glycemic and systemic control.  Recommend?ivA?OS?today?(provided?Good Days paperwork today as backup)?  ?  2. PCIOL?OU  Possibly visually significant  Monitor for now  ?  3.?Dry eye syndrome OU  AT and warm compresses  ?  Signs and symptoms of retinal tears and retinal detachment were reviewed in details with the patient. Patient was instructed to immediately present for evaluation or seek medical help if increased flashes, increased floaters, any decrease in vision, or curtain in field of vision.    F/u 3-60m or sooner prn  Oct, optos  ?

## 2020-11-09 ENCOUNTER — Ambulatory Visit: Admit: 2020-11-09 | Discharge: 2020-11-09 | Payer: No Typology Code available for payment source

## 2020-11-09 ENCOUNTER — Encounter: Admit: 2020-11-09 | Discharge: 2020-11-09 | Payer: No Typology Code available for payment source

## 2020-11-09 DIAGNOSIS — H1131 Conjunctival hemorrhage, right eye: Secondary | ICD-10-CM

## 2020-11-09 NOTE — Progress Notes
Ophthalmology Clinic    Encounter Date: 11/09/2020    Subjective:    Angela Bradshaw is a 65 y.o. female .  Subjective   Eye Exam (Same Day), Vision Change (Pt received an injection OU Monday of this week but reports OD has been very red, dry and sore since. ), Spots and/or Floaters (Denies ), and Medications Only (Ats prn for comfort)      HPI   Patient presents with:  Eye Exam: Same Day  Vision Change: Pt received an injection OU Monday of this week but reports OD has been very red, dry and sore since.   Spots and/or Floaters: Denies   Medications Only: Ats prn for comfort    Right eye has been more bothersome to pt since injection, less bothersome today. Is also bothered by the redness.       Objective  Base Eye Exam     Visual Acuity (Snellen - Linear)       Right Left    Dist sc 20/40 -2 20/60 +2    Dist ph sc NI NI          Tonometry (iCare Tonometer, 1:16 PM)       Right Left    Pressure 11 13          Pupils       APD    Right None    Left None          Visual Fields       Left Right     Full Full          Neuro/Psych     Oriented x3: Yes    Mood/Affect: Normal          Dilation     Both eyes: 1.0% Tropicamide, 2.5% Phenylephrine @ 1:30 PM            Slit Lamp and Fundus Exam     External Exam       Right Left    External Normal Normal          Slit Lamp Exam       Right Left    Lids/Lashes Normal Normal    Conjunctiva/Sclera Diffuse SCH tr inferotemp SCH    Cornea several diffuse PEE several diffuse PEE    Anterior Chamber Deep and quiet Deep and quiet    Iris Flat Flat    Lens PCIOL with PCO centrally PCIOL with PCO    Vitreous Normal subhyaloid vit hemorrhage           Fundus Exam       Right Left    Disc Sharp, healthy rim     C/D Ratio 0.3     Macula MAs, DBH, hard exudates, edema     Vessels Attenuated     Periphery MAs, DBHs, CWS                      Problem   Conjunctival Hemorrhage of Right Eye       Conjunctival hemorrhage of right eye  sub conj heme after injection  no sign of infection   use PFATs dor comfort   does have PCO OD, may be visually significant  will see back in 1 month          Follow-Up: 1 month     Donata Clay, MD  Staff Ophthalmologist

## 2020-11-09 NOTE — Assessment & Plan Note
sub conj heme after injection  no sign of infection   use PFATs dor comfort   does have PCO OD, may be visually significant  will see back in 1 month

## 2020-12-06 ENCOUNTER — Ambulatory Visit: Admit: 2020-12-06 | Discharge: 2020-12-06 | Payer: No Typology Code available for payment source

## 2020-12-06 ENCOUNTER — Encounter: Admit: 2020-12-06 | Discharge: 2020-12-06 | Payer: No Typology Code available for payment source

## 2020-12-06 DIAGNOSIS — H26491 Other secondary cataract, right eye: Secondary | ICD-10-CM

## 2020-12-06 DIAGNOSIS — H1131 Conjunctival hemorrhage, right eye: Secondary | ICD-10-CM

## 2020-12-06 MED ORDER — PHENYLEPHRINE HCL 2.5 % OP DROP
1 [drp] | Freq: Once | OPHTHALMIC | 0 refills
Start: 2020-12-06 — End: ?

## 2020-12-06 MED ORDER — TROPICAMIDE 1 % OP DROP
1 [drp] | Freq: Once | OPHTHALMIC | 0 refills
Start: 2020-12-06 — End: ?

## 2020-12-06 MED ORDER — TETRACAINE HCL (PF) 0.5 % OP DROP
1 [drp] | OPHTHALMIC | 0 refills
Start: 2020-12-06 — End: ?

## 2020-12-06 NOTE — Assessment & Plan Note
Resolved

## 2020-12-06 NOTE — Progress Notes
Ophthalmology Clinic    Encounter Date: 12/06/2020    Subjective:    Angela Bradshaw is a 65 y.o. female .  Subjective   Eye Exam (Return Pt; conj hem OD ), Vision Change (Pt presents with no changes or concerns OU. Pt reports OD is feeling better. ), Spots and/or Floaters (Denies any new flashes or floaters OU), and Medications Only (ATS prn OU)      HPI   Feel vision has gotten better since last visit 1 month ago.  Still has gritty sensation intermittently, has not felt it this week  Uses Systane Ats and saline drops twice a day and feels like they're helping.  Occasionally uses Wcs and feels like it helps.  Sub conj heme has resolved.  Denies pain and redness           Objective  Base Eye Exam     Visual Acuity (Snellen - Linear)       Right Left    Dist sc 20/50 20/60 +1    Dist ph sc 20/40 +1 20/60 +2          Tonometry (iCare Tonometer, 8:49 AM)       Right Left    Pressure 12 10          Pupils       Pupils Dark APD    Right PERRL 4 None    Left PERRL 4 None          Visual Fields       Left Right     Full Full          Neuro/Psych     Oriented x3: Yes    Mood/Affect: Normal            Slit Lamp and Fundus Exam     External Exam       Right Left    External Normal Normal          Slit Lamp Exam       Right Left    Lids/Lashes Normal Normal    Conjunctiva/Sclera White and quiet tr inferotemp SCH    Cornea few PEE several diffuse PEE    Anterior Chamber Deep and quiet Deep and quiet    Iris Flat Flat    Lens PCIOL with PCO centrally PCIOL with PCO    Vitreous Normal subhyaloid vit hemorrhage                      Problem   Pco (Posterior Capsular Opacification), Right   Conjunctival Hemorrhage of Right Eye       PCO (posterior capsular opacification), right  The patient presents with visually significant posterior capsule opacifications. I am recommending we proceed with YAG capsulotomy. Risks, benefits, and alternatives explained to patient including but not limited to inflammation, retinal detachment, increased intraocular pressure, and loss of vision. The patient understands these risks and agrees to proceed.    Plan for OD only     Conjunctival hemorrhage of right eye  Resolved              Donata Clay, MD  Staff Ophthalmologist

## 2020-12-06 NOTE — Assessment & Plan Note
The patient presents with visually significant posterior capsule opacifications. I am recommending we proceed with YAG capsulotomy. Risks, benefits, and alternatives explained to patient including but not limited to inflammation, retinal detachment, increased intraocular pressure, and loss of vision. The patient understands these risks and agrees to proceed.    Plan for OD only

## 2020-12-08 ENCOUNTER — Encounter: Admit: 2020-12-08 | Discharge: 2020-12-08 | Payer: No Typology Code available for payment source

## 2020-12-08 DIAGNOSIS — M1990 Unspecified osteoarthritis, unspecified site: Secondary | ICD-10-CM

## 2020-12-08 DIAGNOSIS — I10 Essential (primary) hypertension: Secondary | ICD-10-CM

## 2020-12-08 DIAGNOSIS — E119 Type 2 diabetes mellitus without complications: Secondary | ICD-10-CM

## 2020-12-08 MED ADMIN — TROPICAMIDE 1 % OP DROP [8250]: 1 [drp] | OPHTHALMIC | @ 19:00:00 | Stop: 2020-12-08 | NDC 61314035501

## 2020-12-08 MED ADMIN — TETRACAINE HCL (PF) 0.5 % OP DROP [305966]: 1 [drp] | OPHTHALMIC | @ 19:00:00 | Stop: 2020-12-08 | NDC 00065074114

## 2020-12-08 MED ADMIN — ARTIFICIAL TEARS MULTI DOSE GEL GROUP [280012]: 1 [drp] | OPHTHALMIC | @ 19:00:00 | Stop: 2020-12-08 | NDC 00078042947

## 2020-12-08 MED ADMIN — PHENYLEPHRINE HCL 2.5 % OP DROP [6246]: 1 [drp] | OPHTHALMIC | @ 19:00:00 | Stop: 2020-12-08 | NDC 17478020102

## 2020-12-10 ENCOUNTER — Encounter: Admit: 2020-12-10 | Discharge: 2020-12-10 | Payer: No Typology Code available for payment source

## 2020-12-10 DIAGNOSIS — E119 Type 2 diabetes mellitus without complications: Secondary | ICD-10-CM

## 2020-12-10 DIAGNOSIS — I10 Essential (primary) hypertension: Secondary | ICD-10-CM

## 2020-12-10 DIAGNOSIS — M1990 Unspecified osteoarthritis, unspecified site: Secondary | ICD-10-CM

## 2020-12-16 ENCOUNTER — Encounter: Admit: 2020-12-16 | Discharge: 2020-12-16 | Payer: No Typology Code available for payment source

## 2020-12-16 ENCOUNTER — Ambulatory Visit: Admit: 2020-12-16 | Discharge: 2020-12-16 | Payer: No Typology Code available for payment source

## 2020-12-16 DIAGNOSIS — H26491 Other secondary cataract, right eye: Secondary | ICD-10-CM

## 2020-12-16 DIAGNOSIS — E113512 Type 2 diabetes mellitus with proliferative diabetic retinopathy with macular edema, left eye: Secondary | ICD-10-CM

## 2020-12-16 DIAGNOSIS — I10 Essential (primary) hypertension: Secondary | ICD-10-CM

## 2020-12-16 DIAGNOSIS — E119 Type 2 diabetes mellitus without complications: Secondary | ICD-10-CM

## 2020-12-16 DIAGNOSIS — E113411 Type 2 diabetes mellitus with severe nonproliferative diabetic retinopathy with macular edema, right eye: Secondary | ICD-10-CM

## 2020-12-16 DIAGNOSIS — H3562 Retinal hemorrhage, left eye: Secondary | ICD-10-CM

## 2020-12-16 DIAGNOSIS — M1990 Unspecified osteoarthritis, unspecified site: Secondary | ICD-10-CM

## 2020-12-16 NOTE — Progress Notes
Assessment and Plan:    Problem   Pco (Posterior Capsular Opacification), Right       PCO (posterior capsular opacification), right  S/p YAG OD    Capsule open today    Vision down secondary to increased macular edema OU    Needs to see PCP for better blood glucose control    Needs to get into Dr Elpidio Anis for possible injection OU           No follow-ups on file.  [place] E Arnol Mcgibbon, MD  Ophthalmology Resident PGY4  Pager Number [phone]    [place] E Ardel Jagger  [facility] of Ophthalmology      HPI:       Exam:  [facility] Exam     Visual Acuity (Snellen - Linear)       Right Left    Dist sc 20/70 20/70          Tonometry (iCare Tonometer, 10:23 AM)       Right Left    Pressure 16 14          Neuro/Psych     Oriented x3: Yes    Mood/Affect: Normal            Slit Lamp and Fundus Exam     External Exam       Right Left    External Normal Normal          Slit Lamp Exam       Right Left    Lids/Lashes Normal Normal    Conjunctiva/Sclera White and quiet tr inferotemp SCH    Cornea few PEE several diffuse PEE    Anterior Chamber Deep and quiet Deep and quiet    Iris Flat Flat    Lens PCIOL open PC PCIOL with PCO    Anterior Vitreous Normal subhyaloid vit hemorrhage             Refraction     Manifest Refraction (Auto)       Sphere Cylinder Axis Dist VA    Right -1.00 +2.25 008 20/50-2    Left [place] +2.50 109 20/60                OCT MACULA           Macula  Right Eye  Macula findings: Epiretinal membrane, Loss of foveal contour, Intraretinal fluid Macula progression has worsened.     Left Eye  Macula findings: Epiretinal membrane, Loss of foveal contour, Intraretinal fluid Macula progression has worsened.     Notes  Increased CME OU

## 2020-12-16 NOTE — Assessment & Plan Note
S/p YAG OD    Capsule open today    Vision down secondary to increased macular edema OU    Needs to see PCP for better blood glucose control    Needs to get into Dr Elpidio Anis for possible injection OU

## 2021-02-06 ENCOUNTER — Encounter: Admit: 2021-02-06 | Discharge: 2021-02-06 | Payer: No Typology Code available for payment source

## 2021-02-06 ENCOUNTER — Ambulatory Visit: Admit: 2021-02-06 | Discharge: 2021-02-06 | Payer: No Typology Code available for payment source

## 2021-02-06 DIAGNOSIS — I10 Essential (primary) hypertension: Secondary | ICD-10-CM

## 2021-02-06 DIAGNOSIS — E119 Type 2 diabetes mellitus without complications: Secondary | ICD-10-CM

## 2021-02-06 DIAGNOSIS — H4311 Vitreous hemorrhage, right eye: Secondary | ICD-10-CM

## 2021-02-06 DIAGNOSIS — E113513 Type 2 diabetes mellitus with proliferative diabetic retinopathy with macular edema, bilateral: Secondary | ICD-10-CM

## 2021-02-06 DIAGNOSIS — H35373 Puckering of macula, bilateral: Secondary | ICD-10-CM

## 2021-02-06 DIAGNOSIS — M1990 Unspecified osteoarthritis, unspecified site: Secondary | ICD-10-CM

## 2021-02-06 DIAGNOSIS — E113512 Type 2 diabetes mellitus with proliferative diabetic retinopathy with macular edema, left eye: Secondary | ICD-10-CM

## 2021-02-06 DIAGNOSIS — Z961 Presence of intraocular lens: Secondary | ICD-10-CM

## 2021-02-06 MED ORDER — BEVACIZUMAB 1.25MG/0.05ML INTRAVIT SYR
1.25 mg | Freq: Once | 0 refills | Status: CP
Start: 2021-02-06 — End: ?
  Administered 2021-02-06: 23:00:00 1.25 mg

## 2021-02-06 MED ORDER — ACETAMINOPHEN 500 MG PO TAB
1000 mg | ORAL | 0 refills | Status: DC | PRN
Start: 2021-02-06 — End: 2021-02-07
  Administered 2021-02-06: 23:00:00 1000 mg via ORAL

## 2021-02-06 NOTE — Operative Report(Direct Entry)
OPERATIVE REPORT    Name: Angela Bradshaw is a 65 y.o. female     DOB: 06-02-1955             MRN#: [id number]    DATE OF OPERATION: 02/06/2021    Surgeon(s) and Role:     * Anise Salvo, MD - Primary        Preoperative Diagnosis:    Vitreous hemorrhage of right eye (HCC) [H43.11]  PDR OD    Post-op Diagnosis      * Vitreous hemorrhage of right eye (HCC) [H43.11]  PDR OD    Procedure(s) (LRB):  TREATMENT EXTENSIVE/ RETINOPATHY - PHOTOCOAGULATION (Right)     Laser Used: Iridex IQ -577nm-yellow laser  Power/Energy: 300  Number of Spots: 449  Duration: 100  Interval: 150  Lens used: volk 28D  Delivery System: LIO      Estimated Blood Loss:  No blood loss documented.     Specimen(s) Removed/Disposition: * No specimens in log *    Attestation: I performed this procedure without the involvement of a resident.    Complications:  None      Implants: * No implants in log *    Drains: None    Disposition:  PACU - stable    [name], MBBCh  Pager

## 2021-02-06 NOTE — Progress Notes
OCT macula  OD:?ERM, non CI DME,  partial PVD  OS:?ERM with loss of foveal contour,DME nasal fovea  ?  ?  ?  I discussed diagnosis and plan for intravitreal injection with patient. Risks, benefits and alternatives were discussed with patient. Risk of infection, and signs and symptoms of infection discussed at length with patient. Discussed fluctuating and deteriorating vision over the course of treatment. Patient elects to proceed with injection, informed consent was obtained and all questions were answered.     Injection procedure:  ?  Site:   ?  OD  x   OS  x   Posterior Sub-Tenon  ?   Subconj  ?   Intravitreous  ?   ?  Pre-injection drops:   Tetracaine 0.5% drops  x    5% Betadine  x    Betadine swab to lids  x    Speculum used  ?   Other  ?   ?   Injection medication:   ? Concentration  Volume (in mL)    Aflibercept (Eylea)  2 mg / 0.05 mL  ?   Ranibizumab (Lucentis)  0.3 mg / 0.05 mL  ?   Ranibizumab (Lucentis)  0.5 mg / 0.05 mL  ?   Bevacizumab (Avastin)  1.25 mg / 0.05 mL  xx   Triamcinolone (Kenalog)  40 mg / mL  ?   Triamcinolone (Triesence)  40 mg / mL  ?   Vancomycin  1 mg / 100 microLiters  ?   Ceftazidime  2 mg / 100 microLiters  ?   Kenalog Triamcinolone 40mg /ml ?   Ozurdex Dexamethason 0.7mg  implant ?   ?  Lot #  Expiration date:   ?  Injection Needle:   27 Gauge Needle  ?   30 Gauge Needle  xx   Needle supplied with medication  ?   ?  Post-operative examination:   Central retinal artery perfused by vision check Xx   Intraocular pressure checked by tonopen and found to be ___ mmHg  ?   Intraocular pressure checked by applanation and found to be ___ mmHg  ?     ?  I personally performed the Injection  ?  ?  ?  Assessment and Plan:  ?  ?  Referred in by Dr. Cheron Every at [facility]  ?  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  HLD  ?  Medications: Gabapentin, metformin, atorvastatin, aspirin  ?  1.?PDR OU    S/p prp OS  Stress importance of glycemic and systemic control.  Recommend?ivA?OU?today?   Plan for prp OD (today if possible after discussion with the patient and daughter)  ?  2. PCIOL?OU  Possibly visually significant PCO OS  Patient notes considerable glare today  Follow-up with Dr. Nino Parsley    3- Subhyaloid hge OD  Resolving  Stress importance of glycemic and systemic control.  ?  4- ?Dry eye syndrome OU  AT and warm compresses  ?  Signs and symptoms of retinal tears and retinal detachment were reviewed in details with the patient. Patient was instructed to immediately present for evaluation or seek medical help if increased flashes, increased floaters, any decrease in vision, or curtain in field of vision.  ?  F/u 25m or sooner prn  Oct, optos    [place] Jensen, MD  [place] [facility] PGY-2  Available on Voalte/AMS  [phone]    ?

## 2021-02-06 NOTE — H&P (View-Only)
Ophthalmology Preoperative History and Physical Exam - @MYYEAR @    CC/Reason for Surgery: 1- PDR OD  2- vit hge OD    HPI:   1- PDR OD  2- vit hge OD    Past Medical History:  Medical History:   Diagnosis Date   ? Arthritis    ? DM (diabetes mellitus) (HCC)    ? Hypertension         Past Surgical History:  Surgical History:   Procedure Laterality Date   ? TREATMENT EXTENSIVE/ RETINOPATHY - PHOTOCOAGULATION Left 02/21/2018    Performed by Ronnald Collum, MD at [facility] OR   ? TREATMENT EXTENSIVE/ RETINOPATHY - PHOTOCOAGULATION Left 03/14/2018    Performed by Ronnald Collum, MD at [facility] OR   ? TREATMENT EXTENSIVE/ RETINOPATHY - PHOTOCOAGULATION Left 09/16/2020    Performed by Anise Salvo, MD at [facility] OR   ? LASER DISCISSION SECONDARY MEMBRANOUS CATARACT Right 12/08/2020    Performed by Gwendolyn Grant, MD at [facility] OR        Past Ocular History:  PDR OS    Allergies:  No Known Allergies     Social History:  Social History     Socioeconomic History   ? Marital status: Married   Tobacco Use   ? Smoking status: Never   ? Smokeless tobacco: Never   Vaping Use   ? Vaping Use: Never used   Substance and Sexual Activity   ? Alcohol use: Never   ? Drug use: Never        Medications:  No current facility-administered medications for this encounter.    Facility-Administered Medications Ordered in Other Encounters:   ?  bevacizumab (AVASTIN) intravitreal injection 1.25 mg, 1.25 mg, , Once, Arshia Rondon S, MD  ?  bevacizumab (AVASTIN) intravitreal injection 1.25 mg, 1.25 mg, , Once, Jordy Verba S, MD     Family History:  Family History   Family history unknown: Yes        ROS:   Constitutional: WNL   Eyes: See HPI   Ears: WNL   CV: WNL   Resp: WNL   Gastro: WNL   Musculo: WNL   Skin: WNL   Neuro: WNL     Physical Exam:  See nursing intake for vitals    General: No acute distress  HEENT: NC/AT  CV: RRR.  No murmurs/rubs/gallops detected  Resp: CTA Bilaterally  Musculoskeletal: WNL, able to lay flat    Lab Results:  CBC w/Diff    No results found for: WBC, RBC, HGB, HCT, MCV, MCH, MCHC, RDW, PLTCT, MPV No results found for: NEUT, ANC, LYMA, ALC, MONA, AMC, EOSA, AEC, BASA, ABC       Covid-19 Testing:  No results found for: COV1, COV2          Assessment:  1- PDR OD  2- vit hge OD      Plan: To laser room for PRP OD    An extensive discussion took place with the patient concerning the risks, benefits and alternatives to the above procedure. The patient was given the opportunity to have all questions answered. At the conclusion of our discussion, signed informed consent was obtained.     Ronnald Collum, MBBCh   Retina and Vitreous Surgery  Department of Ophthalmology  [facility]      [facility]  [address] [place], [place] [address]  Ph:  [phone]

## 2021-02-06 NOTE — Discharge Instructions - Supplementary Instructions
[facility]  SPECIALTY  SURGERY  CENTER                            LASER  POST-OPERATIVE  INSTRUCTIONS    POST-OPERATIVE  EYE  DROP  SCHEDULE  No Eye Drops Needed  Prednisolone (shake  first)  1 drop  right / left eye      _______times  a  day  _____________________  1 drop  right / left eye      _______times  a  day  HOME  INSTRUCTIONS  Continue all the prescribed medication in your non-operative eye as usual  No eye patch or shield is needed  Resume all of your home medications today, unless instructed otherwise by your doctor  Resume all normal activities  Additional Instructions_________________________________________________________  ____________________________________________________________________________                 WHAT  TO  EXPECT  AFTER  SURGERY:  Your eye may feel irritated, scratchy, or like something is in it  Your eye may appear red or bloodshot, this will lessen as your eye heals  Your vision may be blurry and eyes sensitive to light following the procedure    WHAT TO WATCH / REPORT  FOR  AFTER  SURGERY:  Sudden decrease in vision  New or an increase in flashes of light or floaters  Sudden increase of pain or pain not relieved by over the counter pain medication                     Call the Surgery Center at [phone] or the [facility]         at [phone] (even after hours) for questions, problems or concerns                                                                                                 Patient Signature________________________________Date_________Time____________        Nurse Signature____________________________________

## 2021-02-06 NOTE — Progress Notes
1621: Interpreter services utilized for communication during Intra op and post op procedure and discharge instructions.  # [id number] Matilde Sprang) completed the pt encounter.  Karren Cobble, RN

## 2021-02-06 NOTE — Progress Notes
Pt admitted to preop bay 2 in prep for laser procedure.  Interpreter services utilized # [phone] Matilde Sprang) for communication.  Karren Cobble, RN

## 2021-02-08 ENCOUNTER — Encounter: Admit: 2021-02-08 | Discharge: 2021-02-08 | Payer: No Typology Code available for payment source

## 2021-02-08 DIAGNOSIS — M1990 Unspecified osteoarthritis, unspecified site: Secondary | ICD-10-CM

## 2021-02-08 DIAGNOSIS — E119 Type 2 diabetes mellitus without complications: Secondary | ICD-10-CM

## 2021-02-08 DIAGNOSIS — I10 Essential (primary) hypertension: Secondary | ICD-10-CM

## 2021-08-24 ENCOUNTER — Encounter: Admit: 2021-08-24 | Discharge: 2021-08-24 | Payer: No Typology Code available for payment source

## 2021-09-05 ENCOUNTER — Encounter: Admit: 2021-09-05 | Discharge: 2021-09-05 | Payer: No Typology Code available for payment source

## 2021-09-06 ENCOUNTER — Encounter: Admit: 2021-09-06 | Discharge: 2021-09-06 | Payer: No Typology Code available for payment source

## 2021-09-13 ENCOUNTER — Encounter: Admit: 2021-09-13 | Discharge: 2021-09-13 | Payer: No Typology Code available for payment source

## 2021-09-14 ENCOUNTER — Encounter: Admit: 2021-09-14 | Discharge: 2021-09-14 | Payer: No Typology Code available for payment source

## 2021-09-14 NOTE — Telephone Encounter
Spoke with daughter Kara Mead. Patient has been scheduled. Daughter will be joining her to appt.       Patient currently does not have insurance per daughter. Ph: [phone] financial counselors was provided to pt for further assistance. Voiced understanding and has no further questions.

## 2021-09-21 ENCOUNTER — Encounter: Admit: 2021-09-21 | Discharge: 2021-09-21 | Payer: No Typology Code available for payment source

## 2021-09-21 ENCOUNTER — Emergency Department: Admit: 2021-09-21 | Discharge: 2021-09-21 | Payer: No Typology Code available for payment source

## 2021-09-21 DIAGNOSIS — I10 Essential (primary) hypertension: Secondary | ICD-10-CM

## 2021-09-21 DIAGNOSIS — E119 Type 2 diabetes mellitus without complications: Secondary | ICD-10-CM

## 2021-09-21 DIAGNOSIS — M1990 Unspecified osteoarthritis, unspecified site: Secondary | ICD-10-CM

## 2021-09-21 LAB — URINALYSIS MICROSCOPIC REFLEX TO CULTURE

## 2021-09-21 LAB — LIPASE: LIPASE: 93 U/L — ABNORMAL HIGH (ref 11–82)

## 2021-09-21 LAB — D-DIMER: D-DIMER: 575 ng{FEU}/mL — ABNORMAL HIGH (ref <500)

## 2021-09-21 LAB — CBC AND DIFF
ABSOLUTE BASO COUNT: 0 K/UL (ref 0–0.20)
ABSOLUTE EOS COUNT: 0 K/UL (ref 0–0.45)
ABSOLUTE MONO COUNT: 0.4 K/UL (ref 0–0.80)
MDW (MONOCYTE DISTRIBUTION WIDTH): 18 (ref <20.7)
WBC COUNT: 6.3 K/UL (ref 4.5–11.0)

## 2021-09-21 LAB — URINALYSIS DIPSTICK REFLEX TO CULTURE
LEUKOCYTES: NEGATIVE
NITRITE: NEGATIVE
URINE ASCORBIC ACID, UA: POSITIVE — AB
URINE BILE: NEGATIVE
URINE BLOOD: NEGATIVE
URINE KETONE: NEGATIVE

## 2021-09-21 LAB — COMPREHENSIVE METABOLIC PANEL
ALBUMIN: 4.1 g/dL (ref 3.5–5.0)
ALK PHOSPHATASE: 33 U/L (ref 25–110)
ALT: 9 U/L (ref 7–56)
ANION GAP: 10 K/UL (ref 3–12)
AST: 15 U/L (ref 7–40)
BLD UREA NITROGEN: 23 mg/dL (ref 7–25)
CALCIUM: 9.5 mg/dL (ref 8.5–10.6)
CO2: 25 MMOL/L (ref 21–30)
CREATININE: 1.1 mg/dL — ABNORMAL HIGH (ref 0.4–1.00)
EGFR: 53 mL/min — ABNORMAL LOW (ref >60)
GLUCOSE,PANEL: 72 mg/dL (ref 70–100)
SODIUM: 135 MMOL/L — ABNORMAL LOW (ref 137–147)
TOTAL BILIRUBIN: 0.3 mg/dL (ref 0.3–1.2)
TOTAL PROTEIN: 6.8 g/dL (ref 6.0–8.0)

## 2021-09-21 LAB — PROTIME INR (PT): PROTIME: 10 s (ref 9.5–14.2)

## 2021-09-21 LAB — HIGH SENSITIVITY TROPONIN I 0 HOUR: HIGH SENSITIVITY TROPONIN I 0 HOUR: 4 ng/L — ABNORMAL LOW (ref <12)

## 2021-09-21 LAB — PTT (APTT): PTT: 27 s (ref 24.0–36.5)

## 2021-09-21 LAB — MAGNESIUM: MAGNESIUM: 1.6 mg/dL (ref 1.6–2.6)

## 2021-09-21 LAB — TSH WITH FREE T4 REFLEX: TSH: 2.1 uU/mL (ref 0.35–5.00)

## 2021-09-21 LAB — HIGH SENSITIVITY TROPONIN I 2 HOUR: HIGH SENSITIVITY TROPONIN I 2 HOUR: 3 ng/L — ABNORMAL LOW (ref <12)

## 2021-09-21 MED ORDER — ASPIRIN 325 MG PO TAB
325 mg | Freq: Once | ORAL | 0 refills | Status: CP
Start: 2021-09-21 — End: ?
  Administered 2021-09-22: 04:00:00 325 mg via ORAL

## 2021-09-21 MED ORDER — ACETAMINOPHEN 500 MG PO TAB
1000 mg | Freq: Once | ORAL | 0 refills | Status: CP
Start: 2021-09-21 — End: ?
  Administered 2021-09-22: 04:00:00 1000 mg via ORAL

## 2021-09-22 NOTE — ED Notes
ED Initial Provider Note:    This patient was seen in the ED triage area to initiate and expedite the patients ED care when possible.    ED Chief Complaint:   Chief Complaint   Patient presents with    Shortness of Breath     Over the past few months, increased today w/ chest pain. Patient endorsing anxiety        S: Angela Bradshaw is a 66 y.o. female who presents to the Emergency Department for worsening anxiety, chest pain, SOA over the past few months. Pt's daughter states pt has been complaining of feeling worse and has been decreasing her medicines because she thinks they are making her worse. Endorses decreased appetite, fatigue, and weakness. Reportedly hospitalized for a kidney infection at [facility] last month.     PMHx:  Medical History:   Diagnosis Date    Arthritis     DM (diabetes mellitus) (HCC)     Hypertension        BP 125/58 (BP Source: Arm, Right Upper)  - Pulse 82  - Temp 37.5 C (99.5 F)  - Ht 165.1 cm (5\' 5" )  - Wt 56.7 kg (125 lb)  - SpO2 100%  - BMI 20.80 kg/m    O: Brief Physical: Pt is a&ox3. Appears weak, slightly pale. Lung sounds clear, heart sounds normal.     A/P: The patient was seen by me as an initial provider in triage. A brief history and physical was obtained. My exam is intended to be an initial medial screening exam. Initial orders have been placed by me. My working diagnosis is ACS, hyperglycemia, anxiety, UTI, pyelo.    The patient is deemed appropriate for the main ED. The patient's care will be resumed by the ED provider care team once the patient is roomed in the ED. A more detailed / complete H&P will be documented by those providers.

## 2021-09-22 NOTE — ED Notes
Angela Bradshaw is a 66 yo Spanish speaking woman who came into the ED today with CC of anxiety & burning w/ urination. Per pt, she has had depression and anxiety for quite some time, but it has elevated since a recent admission for a kidney infection this past month. Pt reports since, she has had financial difficulty, has not been working, and requires increased support from family to maintain her home, all of which have escalated her anxiety. Pt denies taking any medication for depression/anxiety. Pt reports SOA and chest tightness d/t anxiety, but denies both at time of this RN's evaluation. Pt reporting decreased urine output, new burning with urination, and increased foul odor to urine for ~3days. Pt denies fever/chills, as well as ever having a UTI in the past. Pt reports she has been told she needs her gallbladder removed, and expressed associated financial concern. Pt denies N/V/D, constipation, abd pain, and/or fevers/chills. Pt is A&Ox4, per daughter, forgetful at bedside. Pt breathing is non-labored, eupneic on RA, skin is appropriate to age and ethnicity. Pt resting in bed, locked in lowest position, side rails up, call light in reach. Pt daughter at bedside.    Belongings: shirt, pants, socks, shoes, undergarments    Medical History:   Diagnosis Date    Arthritis     DM (diabetes mellitus) (HCC)     Hypertension

## 2021-09-22 NOTE — ED Notes
Report to [facility]

## 2021-09-26 ENCOUNTER — Encounter: Admit: 2021-09-26 | Discharge: 2021-09-26

## 2021-11-07 ENCOUNTER — Encounter: Admit: 2021-11-07 | Discharge: 2021-11-07

## 2021-11-14 ENCOUNTER — Encounter: Admit: 2021-11-14 | Discharge: 2021-11-14

## 2021-11-30 ENCOUNTER — Ambulatory Visit: Admit: 2021-11-30 | Discharge: 2021-11-30 | Payer: Medicaid Other

## 2021-11-30 ENCOUNTER — Encounter: Admit: 2021-11-30 | Discharge: 2021-11-30 | Payer: Medicaid Other

## 2021-11-30 DIAGNOSIS — E119 Type 2 diabetes mellitus without complications: Secondary | ICD-10-CM

## 2021-11-30 DIAGNOSIS — E113592 Type 2 diabetes mellitus with proliferative diabetic retinopathy without macular edema, left eye: Secondary | ICD-10-CM

## 2021-11-30 DIAGNOSIS — E113513 Type 2 diabetes mellitus with proliferative diabetic retinopathy with macular edema, bilateral: Secondary | ICD-10-CM

## 2021-11-30 DIAGNOSIS — M1990 Unspecified osteoarthritis, unspecified site: Secondary | ICD-10-CM

## 2021-11-30 DIAGNOSIS — I10 Essential (primary) hypertension: Secondary | ICD-10-CM

## 2021-11-30 DIAGNOSIS — E113512 Type 2 diabetes mellitus with proliferative diabetic retinopathy with macular edema, left eye: Secondary | ICD-10-CM

## 2021-11-30 DIAGNOSIS — H35373 Puckering of macula, bilateral: Secondary | ICD-10-CM

## 2021-11-30 DIAGNOSIS — Z961 Presence of intraocular lens: Secondary | ICD-10-CM

## 2021-11-30 DIAGNOSIS — H04129 Dry eye syndrome of unspecified lacrimal gland: Secondary | ICD-10-CM

## 2021-11-30 MED ORDER — CYCLOPENTOLATE 1 % OP DROP
1 [drp] | OPHTHALMIC | 0 refills | Status: CN
Start: 2021-11-30 — End: ?

## 2021-11-30 MED ORDER — PHENYLEPHRINE HCL 2.5 % OP DROP
1 [drp] | OPHTHALMIC | 0 refills | Status: CN
Start: 2021-11-30 — End: ?

## 2021-11-30 MED ORDER — TROPICAMIDE 1 % OP DROP
1 [drp] | OPHTHALMIC | 0 refills | Status: CP
Start: 2021-11-30 — End: ?
  Administered 2021-11-30: 21:00:00 1 [drp] via OPHTHALMIC

## 2021-11-30 MED ORDER — TETRACAINE HCL (PF) 0.5 % OP DROP
1 [drp] | OPHTHALMIC | 0 refills | Status: DC
Start: 2021-11-30 — End: 2021-11-30
  Administered 2021-11-30: 21:00:00 1 [drp] via OPHTHALMIC

## 2021-11-30 MED ORDER — CYCLOPENTOLATE 1 % OP DROP
1 [drp] | OPHTHALMIC | 0 refills | Status: CP
Start: 2021-11-30 — End: ?
  Administered 2021-11-30: 21:00:00 1 [drp] via OPHTHALMIC

## 2021-11-30 MED ORDER — TROPICAMIDE 1 % OP DROP
1 [drp] | OPHTHALMIC | 0 refills | Status: CN
Start: 2021-11-30 — End: ?

## 2021-11-30 MED ORDER — PHENYLEPHRINE HCL 2.5 % OP DROP
1 [drp] | OPHTHALMIC | 0 refills | Status: CP
Start: 2021-11-30 — End: ?
  Administered 2021-11-30: 21:00:00 1 [drp] via OPHTHALMIC

## 2021-11-30 MED ORDER — TETRACAINE HCL (PF) 0.5 % OP DROP
1 [drp] | OPHTHALMIC | 0 refills | Status: CN
Start: 2021-11-30 — End: ?

## 2021-11-30 NOTE — Progress Notes
Body mass index is 21.26 kg/m.      OCT MACULA             FUNDUS PHOTOGRAPHY, OU-BOTH EYES               OCT macula  OD:ERM, small non CI DME,  partial PVD  OS:ERM with loss of foveal contour, DMEnasal fovea            Assessment and Plan:         Referred in by Dr. Stacie Acres at [facility]    PMH:  DM1for 30 years, taking metformin and another oral medication, no insulin,   Joint problem?  HLD    Medications: Gabapentin, metformin, atorvastatin, aspirin    1.PDR OU  S/p prp OS  Stress importance of glycemic and systemic control.  Plan for prp OD (today if possible after discussion with the patient and daughter)    2. PCIOLOU  Possibly visually significant PCO OS  Patient notes considerable glare today  Follow-up with Dr. Mikael Spray    3- Dry eye syndrome OU  AT and warm compresses    Signs and symptoms of retinal tears and retinal detachment were reviewed in details with the patient. Patient was instructed to immediately present for evaluation or seek medical help if increased flashes, increased floaters, any decrease in vision, or curtain in field of vision.    F/u 6-47m or sooner prn  Oct, optos

## 2021-11-30 NOTE — H&P (View-Only)
Ophthalmology Preoperative History and Physical Exam - @MYYEAR @    CC/Reason for Surgery: PDR OU    HPI:   PDR OU  Dry eye syndrome OU    Past Medical History:  Medical History:   Diagnosis Date   ? Arthritis    ? DM (diabetes mellitus) (HCC)    ? Hypertension         Past Surgical History:  Surgical History:   Procedure Laterality Date   ? TREATMENT EXTENSIVE/ RETINOPATHY - PHOTOCOAGULATION Left 02/21/2018    Performed by Ronnald Collum, MD at [facility] OR   ? TREATMENT EXTENSIVE/ RETINOPATHY - PHOTOCOAGULATION Left 03/14/2018    Performed by Ronnald Collum, MD at [facility] OR   ? TREATMENT EXTENSIVE/ RETINOPATHY - PHOTOCOAGULATION Left 09/16/2020    Performed by Anise Salvo, MD at [facility] OR   ? LASER DISCISSION SECONDARY MEMBRANOUS CATARACT Right 12/08/2020    Performed by Gwendolyn Grant, MD at [facility] OR   ? TREATMENT EXTENSIVE/ RETINOPATHY - PHOTOCOAGULATION Right 02/06/2021    Performed by Anise Salvo, MD at [facility] OR        Past Ocular History:  PDR OU  PCIOL OU    Allergies:  No Known Allergies     Social History:  Social History     Socioeconomic History   ? Marital status: Married   Tobacco Use   ? Smoking status: Never   ? Smokeless tobacco: Never   Vaping Use   ? Vaping Use: Never used   Substance and Sexual Activity   ? Alcohol use: Never   ? Drug use: Never        Medications:    Current Facility-Administered Medications:   ?  tetracaine HCL PF (PONTOCAINE) 0.5 % ophthalmic solution 1 drop, 1 drop, Left Eye, As Prescribed, Jaielle Dlouhy S, MD, 1 drop at 11/30/21 1537    Facility-Administered Medications Ordered in Other Encounters:   ?  bevacizumab (AVASTIN) intravitreal injection 1.25 mg, 1.25 mg, , Once, Mckinze Poirier S, MD  ?  bevacizumab (AVASTIN) intravitreal injection 1.25 mg, 1.25 mg, , Once, Adriano Bischof S, MD     Family History:  Family History   Family history unknown: Yes        ROS:   Constitutional: WNL   Eyes: See HPI   Ears: WNL   CV: WNL   Resp: WNL   Gastro: WNL   Musculo: WNL   Skin: WNL   Neuro: WNL Physical Exam:  See nursing intake for vitals    General: No acute distress  HEENT: NC/AT  CV: RRR.  No murmurs/rubs/gallops detected  Resp: CTA Bilaterally  Musculoskeletal: WNL, able to lay flat    Lab Results:  CBC w/Diff    Lab Results   Component Value Date/Time    WBC 6.3 09/21/2021 07:31 PM    RBC 3.53 (L) 09/21/2021 07:31 PM    HGB 10.4 (L) 09/21/2021 07:31 PM    HCT 31.4 (L) 09/21/2021 07:31 PM    MCV 89.0 09/21/2021 07:31 PM    MCH 29.5 09/21/2021 07:31 PM    MCHC 33.2 09/21/2021 07:31 PM    RDW 14.6 09/21/2021 07:31 PM    PLTCT 210 09/21/2021 07:31 PM    MPV 8.9 09/21/2021 07:31 PM    Lab Results   Component Value Date/Time    NEUT 53 09/21/2021 07:31 PM    ANC 3.29 09/21/2021 07:31 PM    LYMA 39 09/21/2021 07:31 PM    ALC 2.46 09/21/2021  07:31 PM    MONA 7 09/21/2021 07:31 PM    AMC 0.46 09/21/2021 07:31 PM    EOSA 1 09/21/2021 07:31 PM    AEC 0.06 09/21/2021 07:31 PM    BASA 0 09/21/2021 07:31 PM    ABC 0.02 09/21/2021 07:31 PM          Covid-19 Testing:  No results found for: COV1, COV2        Assessment:  1- PDR OS      Plan: To laser room for PRP alser fill in OS    An extensive discussion took place with the patient concerning the risks, benefits and alternatives to the above procedure. The patient was given the opportunity to have all questions answered. At the conclusion of our discussion, signed informed consent was obtained.     Ronnald Collum, MBBCh   Retina and Vitreous Surgery  Department of Ophthalmology  [facility] of [facility] of Medicine      [facility]  [address] [place], [place] [address]  Ph:  [phone]

## 2021-11-30 NOTE — Discharge Instructions - Supplementary Instructions
[facility]                            LASER  POST-OPERATIVE  INSTRUCTIONS      HOME  INSTRUCTIONS  Continue all the prescribed medication in your non-operative eye as usual  No eye patch or shield is needed  Resume all of your home medications today, unless instructed otherwise by your doctor  Resume all normal activities  Additional Instructions_________________________________________________________  ____________________________________________________________________________                 WHAT  TO  EXPECT  AFTER  SURGERY:  Your eye may feel irritated, scratchy, or like something is in it  Your eye may appear red or bloodshot, this will lessen as your eye heals  Your vision may be blurry and eyes sensitive to light following the procedure    WHAT TO WATCH / REPORT  FOR  AFTER  SURGERY:  Sudden decrease in vision  New or an increase in flashes of light or floaters  Sudden increase of pain or pain not relieved by over the counter pain medication                     Call the [facility] at [phone] or         at [phone] (even after hours) for questions, problems or concerns                                                                                                 Patient Signature________________________________Date_________Time____________        Nurse Signature____________________________________

## 2021-11-30 NOTE — Discharge Planning (AHS/AVS)
Pt given d/c instructions and had no questions or concerns at this time.

## 2021-11-30 NOTE — Operative Report(Direct Entry)
OPERATIVE REPORT    Name: Angela Bradshaw is a 66 y.o. 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     Specimen(s) Removed/Disposition: * No specimens in log *    Attestation: I performed this procedure without the involvement of a resident.    Complications:  None      Implants: * No implants in log *    Drains: Details on drains available on LDA report    Disposition:  PACU - stable    [name] Estill Bakes, MBBCh  Pager

## 2021-12-02 ENCOUNTER — Encounter: Admit: 2021-12-02 | Discharge: 2021-12-02 | Payer: Medicaid Other

## 2021-12-02 DIAGNOSIS — M1990 Unspecified osteoarthritis, unspecified site: Secondary | ICD-10-CM

## 2021-12-02 DIAGNOSIS — I10 Essential (primary) hypertension: Secondary | ICD-10-CM

## 2021-12-02 DIAGNOSIS — E119 Type 2 diabetes mellitus without complications: Secondary | ICD-10-CM

## 2022-01-03 ENCOUNTER — Emergency Department: Admit: 2022-01-03 | Discharge: 2022-01-03 | Payer: Medicaid Other

## 2022-01-03 ENCOUNTER — Encounter: Admit: 2022-01-03 | Discharge: 2022-01-03 | Payer: Medicaid Other

## 2022-01-03 DIAGNOSIS — J189 Pneumonia, unspecified organism: Secondary | ICD-10-CM

## 2022-01-03 LAB — COMPREHENSIVE METABOLIC PANEL
ALBUMIN: 4 g/dL — ABNORMAL HIGH (ref 3.5–5.0)
ALK PHOSPHATASE: 45 U/L — ABNORMAL LOW (ref 25–110)
ALT: 70 U/L — ABNORMAL HIGH (ref 7–56)
ANION GAP: 9 K/UL — ABNORMAL HIGH (ref 3–12)
AST: 97 U/L — ABNORMAL HIGH (ref 7–40)
CALCIUM: 9.2 mg/dL (ref 8.5–10.6)
CO2: 21 MMOL/L (ref 21–30)
CREATININE: 0.8 mg/dL (ref 0.4–1.00)
EGFR: >60 mL/min (ref >60)
SODIUM: 136 MMOL/L — ABNORMAL LOW (ref 137–147)
TOTAL BILIRUBIN: 0.5 mg/dL (ref 0.3–1.2)
TOTAL PROTEIN: 7.2 g/dL (ref 6.0–8.0)

## 2022-01-03 LAB — HIGH SENSITIVITY TROPONIN I 0 HOUR: HIGH SENSITIVITY TROPONIN I 0 HOUR: 6 ng/L — ABNORMAL LOW (ref <12)

## 2022-01-03 LAB — CBC AND DIFF
ABSOLUTE BASO COUNT: 0 K/UL (ref 0–0.20)
ABSOLUTE EOS COUNT: 0 K/UL (ref 0–0.45)
ABSOLUTE MONO COUNT: 0.7 K/UL (ref 0–0.80)
MDW (MONOCYTE DISTRIBUTION WIDTH): 23 — ABNORMAL HIGH (ref <20.7)
WBC COUNT: 10 K/UL (ref 4.5–11.0)

## 2022-01-03 LAB — HIGH SENSITIVITY TROPONIN I 2 HOUR: HIGH SENSITIVITY TROPONIN I 2 HOUR: 6 ng/L — ABNORMAL LOW (ref <12)

## 2022-01-03 LAB — HIGH SENSITIVITY TROPONIN I 4 HR
HI SEN TNI 4 HR: 7 ng/L — ABNORMAL HIGH (ref <12)
HI SEN TNI DELTA 4-2: 1 mg/dL (ref 7–25)

## 2022-01-03 LAB — POC LACTATE: LACTIC ACID POC: 1.9 MMOL/L (ref 0.5–2.0)

## 2022-01-03 MED ORDER — AMOXICILLIN-POT CLAVULANATE 875-125 MG PO TAB
1 | ORAL_TABLET | Freq: Two times a day (BID) | ORAL | 0 refills | 7.00000 days | Status: AC
Start: 2022-01-03 — End: ?
  Filled 2022-01-03: qty 20, 10d supply, fill #1

## 2022-01-03 MED ORDER — IOHEXOL 350 MG IODINE/ML IV SOLN
145 mL | Freq: Once | INTRAVENOUS | 0 refills | Status: CP
Start: 2022-01-03 — End: ?
  Administered 2022-01-03: 13:00:00 145 mL via INTRAVENOUS

## 2022-01-03 MED ORDER — OXYCODONE 5 MG PO TAB
5 mg | ORAL_TABLET | ORAL | 0 refills | 6.00000 days | Status: DC | PRN
Start: 2022-01-03 — End: 2022-01-03
  Filled 2022-01-03: qty 5, 2d supply, fill #1

## 2022-01-03 MED ORDER — FENTANYL CITRATE (PF) 50 MCG/ML IJ SOLN
50 ug | Freq: Once | INTRAVENOUS | 0 refills | Status: CP
Start: 2022-01-03 — End: ?
  Administered 2022-01-03: 13:00:00 50 ug via INTRAVENOUS

## 2022-01-03 MED ORDER — SODIUM CHLORIDE 0.9 % IJ SOLN
50 mL | Freq: Once | INTRAVENOUS | 0 refills | Status: CP
Start: 2022-01-03 — End: ?
  Administered 2022-01-03: 13:00:00 50 mL via INTRAVENOUS

## 2022-01-03 MED ORDER — FENTANYL CITRATE (PF) 50 MCG/ML IJ SOLN
50 ug | Freq: Once | INTRAVENOUS | 0 refills | Status: CP
Start: 2022-01-03 — End: ?
  Administered 2022-01-03: 10:00:00 50 ug via INTRAVENOUS

## 2022-01-03 MED ORDER — OXYCODONE 5 MG PO TAB
5 mg | ORAL_TABLET | ORAL | 0 refills | 6.00000 days | Status: AC | PRN
Start: 2022-01-03 — End: ?

## 2022-01-03 MED ORDER — OXYCODONE 5 MG PO TAB
5 mg | ORAL_TABLET | ORAL | 0 refills | 6.00000 days | Status: DC | PRN
Start: 2022-01-03 — End: 2022-01-03

## 2022-01-03 MED ORDER — LACTATED RINGERS IV SOLP
1000 mL | INTRAVENOUS | 0 refills | Status: CP
Start: 2022-01-03 — End: ?
  Administered 2022-01-03: 10:00:00 1000 mL via INTRAVENOUS

## 2022-01-03 MED ORDER — DOXYCYCLINE HYCLATE 100 MG PO TAB
100 mg | ORAL_TABLET | Freq: Two times a day (BID) | ORAL | 0 refills | 8.00000 days | Status: AC
Start: 2022-01-03 — End: ?
  Filled 2022-01-03: qty 20, 10d supply, fill #1

## 2022-01-03 MED ORDER — ONDANSETRON HCL (PF) 4 MG/2 ML IJ SOLN
4 mg | Freq: Once | INTRAVENOUS | 0 refills | Status: CP
Start: 2022-01-03 — End: ?
  Administered 2022-01-03: 10:00:00 4 mg via INTRAVENOUS

## 2022-01-03 NOTE — ED Provider Notes
Angela Bradshaw is a 66 y.o. female.    Chief Complaint:  Chief Complaint   Patient presents with   ? Chest Pain     66 y/o F presents c/o CP, fever and HA. Pt states these sx started today after her endoscopy. C/o mid-sternal CP, also c/o ST and posterior neck pain.        History of Present Illness:  Angela Bradshaw is a 66 y.o. female with past medical history of diabetes, hypertension, H. pylori with ulcers who presents with chest, neck pain and headache after endoscopy.  Patient had an endoscopy less than 24 hours ago, yesterday morning.  She had this done to assess her response to treatment for H. pylori.  She was told by her treating physician that her lesions looked the same and that she would need to complete another course of therapy.  Her daughter was informed that the patient vomited sometime during or around the procedure.  The patient started having symptoms immediately after the procedure.  She did not have similar symptoms after a prior EGD.  She took a Norco for pain without significant improvement.  She has had an associated cough that causes worsening chest pain.          Review of Systems:  Review of Systems   Constitutional: Positive for fever. Negative for chills.   Eyes: Negative for visual disturbance.   Respiratory: Positive for cough and shortness of breath.    Cardiovascular: Positive for chest pain.   Gastrointestinal: Negative for abdominal pain.   Genitourinary: Negative for dysuria.   Musculoskeletal: Negative for back pain.   Skin: Negative for rash.   Neurological: Positive for headaches.       Allergies:  Patient has no known allergies.    Past Medical History:  Medical History:   Diagnosis Date   ? Arthritis    ? DM (diabetes mellitus) (HCC)    ? Hypertension        Past Surgical History:  Surgical History:   Procedure Laterality Date   ? TREATMENT EXTENSIVE/ RETINOPATHY - PHOTOCOAGULATION Left 02/21/2018    Performed by Ronnald Collum, MD at [facility] OR   ? TREATMENT EXTENSIVE/ RETINOPATHY - PHOTOCOAGULATION Left 03/14/2018    Performed by [facility]nald Collum, MD at [facility] OR   ? TREATMENT EXTENSIVE/ RETINOPATHY - PHOTOCOAGULATION Left 09/16/2020    Performed by Anise Salvo, MD at [facility] OR   ? LASER DISCISSION SECONDARY MEMBRANOUS CATARACT Right 12/08/2020    Performed by Gwendolyn Grant, MD at [facility] OR   ? TREATMENT EXTENSIVE/ RETINOPATHY - PHOTOCOAGULATION Right 02/06/2021    Performed by Anise Salvo, MD at [facility] OR   ? TREATMENT EXTENSIVE/ RETINOPATHY - PHOTOCOAGULATION Left 11/30/2021    Performed by Anise Salvo, MD at [facility] OR       Pertinent medical/surgical history reviewed    Social History:  Social History     Tobacco Use   ? Smoking status: Never   ? Smokeless tobacco: Never   Vaping Use   ? Vaping Use: Never used   Substance Use Topics   ? Alcohol use: Never   ? Drug use: Never     Social History     Substance and Sexual Activity   Drug Use Never             Family History:  Family History   Family history unknown: Yes       Vitals:  ED Vitals  Date and Time T BP P RR SPO2P SPO2 User   01/03/22 1018 -- -- 90 -- -- 93 % JF   01/03/22 1000 -- 117/52 90 -- -- 95 % JF   01/03/22 0900 -- 119/50 94 -- -- 94 % JF   01/03/22 0830 -- 98/56 89 15 PER MINUTE -- 94 % JF   01/03/22 0630 -- 122/56 96 -- -- 96 % MB   01/03/22 0400 -- 122/48 95 -- -- 96 % MM   01/03/22 0330 -- 119/52 94 -- -- 96 % MM   01/03/22 0300 -- 134/58 97 -- -- 96 % MM   01/03/22 0230 -- 119/53 97 -- -- 97 % MM   01/03/22 0216 37.7 ?C (99.8 ?F) 119/52 95 -- -- 97 % MM   01/03/22 0144 -- 125/55 -- -- -- -- SM   01/03/22 0141 36.4 ?C (97.5 ?F) -- 100 18 PER MINUTE -- 99 % SM          Physical Exam:  Physical Exam  Vitals and nursing note reviewed.   Constitutional:       General: She is not in acute distress.     Appearance: Normal appearance. She is normal weight.   HENT:      Head: Normocephalic and atraumatic.      Mouth/Throat:      Mouth: Mucous membranes are moist.      Pharynx: Oropharynx is clear.   Eyes: Extraocular Movements: Extraocular movements intact.      Conjunctiva/sclera: Conjunctivae normal.   Neck:      Comments: Subcutaneous emphysema  Cardiovascular:      Rate and Rhythm: Normal rate and regular rhythm.      Pulses: Normal pulses.   Pulmonary:      Effort: Pulmonary effort is normal.      Breath sounds: Normal breath sounds.   Chest:      Comments: Superior chest subcutaneous emphysema  Abdominal:      General: There is no distension.      Palpations: Abdomen is soft.      Tenderness: There is no abdominal tenderness.   Musculoskeletal:      Cervical back: Neck supple.      Right lower leg: No edema.      Left lower leg: No edema.   Skin:     General: Skin is warm and dry.   Neurological:      Mental Status: She is alert and oriented to person, place, and time. Mental status is at baseline.   Psychiatric:         Mood and Affect: Mood normal.         Behavior: Behavior normal.         Laboratory Results:  Labs Reviewed   CBC AND DIFF - Abnormal       Result Value Ref Range Status    White Blood Cells 10.5  4.5 - 11.0 K/UL Final    RBC 3.33 (*) 4.0 - 5.0 M/UL Final    Hemoglobin 10.0 (*) 12.0 - 15.0 GM/DL Final    Hematocrit 16.1 (*) 36 - 45 % Final    MCV 89.0  80 - 100 FL Final    MCH 29.9  26 - 34 PG Final    MCHC 33.6  32.0 - 36.0 G/DL Final    RDW 09.6  11 - 15 % Final    Platelet Count 158  150 - 400 K/UL Final    MPV 9.5  7 - 11 FL Final    Neutrophils 83 (*) 41 - 77 % Final    Lymphocytes 10 (*) 24 - 44 % Final    Monocytes 7  4 - 12 % Final    Eosinophils 0  0 - 5 % Final    Basophils 0  0 - 2 % Final    Absolute Neutrophil Count 8.67 (*) 1.8 - 7.0 K/UL Final    Absolute Lymph Count 1.07  1.0 - 4.8 K/UL Final    Absolute Monocyte Count 0.74  0 - 0.80 K/UL Final    Absolute Eosinophil Count 0.02  0 - 0.45 K/UL Final    Absolute Basophil Count 0.04  0 - 0.20 K/UL Final    MDW (Monocyte Distribution Width) 23.7 (*) <20.7 Final   COMPREHENSIVE METABOLIC PANEL - Abnormal    Sodium 136 (*) 137 - 147 MMOL/L Final    Potassium 5.0  3.5 - 5.1 MMOL/L Final    Chloride 106  98 - 110 MMOL/L Final    Glucose 191 (*) 70 - 100 MG/DL Final    Blood Urea Nitrogen 21  7 - 25 MG/DL Final    Creatinine 1.61  0.4 - 1.00 MG/DL Final    Calcium 9.2  8.5 - 10.6 MG/DL Final    Total Protein 7.2  6.0 - 8.0 G/DL Final    Total Bilirubin 0.5  0.3 - 1.2 MG/DL Final    Albumin 4.0  3.5 - 5.0 G/DL Final    Alk Phosphatase 45  25 - 110 U/L Final    AST (SGOT) 97 (*) 7 - 40 U/L Final    CO2 21  21 - 30 MMOL/L Final    ALT (SGPT) 70 (*) 7 - 56 U/L Final    Anion Gap 9  3 - 12 Final    eGFR >60  >60 mL/min Final   NT-PRO-BNP - Abnormal    NT-Pro-BNP 541.0 (*) <125 pg/mL Final   CULTURE-BLOOD W/SENSITIVITY   CULTURE-BLOOD W/SENSITIVITY   HIGH SENSITIVITY TROPONIN I 0 HOUR    hs Troponin I 0 Hour 6  <12 ng/L Final   HIGH SENSITIVITY TROPONIN I 2 HOUR    hs Troponin I 2 Hour 6  <12 ng/L Final   HIGH SENSITIVITY TROPONIN I 4 HR    hs Troponin I 4 Hour 7  <12 ng/L Final    hs Troponin I 2-4hr Delta Value 1   Final   POC LACTATE    LACTIC ACID POC 1.9  0.5 - 2.0 MMOL/L Final          Radiology Interpretation:    CT CHEST W CONTRAST   Final Result         1.  Patchy areas of bilateral tree-in-bud opacity, greatest at the left upper lobe with more mild involvement at the right middle lobe and bilateral lower lobes. Findings are most compatible with multifocal infectious or aspiration pneumonia versus infectious bronchiolitis.    2.  No evidence of subcutaneous emphysema, pneumothorax, or pneumomediastinum.   3.  Small hiatal hernia. Mild wall thickened appearance the stomach there is nonspecific though may represent combination of pseudowall thickening from poor distention and mild gastritis given history of H. pylori.      By my electronic signature, I attest that I have personally reviewed the images for this examination and formulated the interpretations and opinions expressed in this report          Finalized by Sherolyn Buba,  M.D. on 01/03/2022 7:30 AM. Dictated by Danella Penton, MBBS on 01/03/2022 6:48 AM.         CT NECK W/CONTRAST   Final Result          1.  No acute abnormality in the neck. No subcutaneous emphysema or focal fluid collection.      2.  Mixed atherosclerotic plaque within the bilateral carotid arteries resulting in areas of mild multifocal stenosis.      By my electronic signature, I attest that I have personally reviewed the images for this examination and formulated the interpretations and opinions expressed in this report          Finalized by Sherolyn Buba, M.D. on 01/03/2022 7:23 AM. Dictated by Danella Penton, MBBS on 01/03/2022 7:01 AM.         CHEST SINGLE VIEW   Final Result         No acute finding. Reported subcutaneous emphysema and anterior chest is not well appreciated on current radiograph.          Finalized by Sherolyn Buba, M.D. on 01/03/2022 4:17 AM. Dictated by Sherolyn Buba, M.D. on 01/03/2022 4:15 AM.               EKG:  ECG Results          [facility] ED MAIN ECG TRIAGE ONLY (Final result)      Collection Time Result Time VT RATE P-R Interval QRS DURATION Q-T Interval QTC Calc Bazett    01/03/22 01:42:13 01/03/22 04:31:47 102 132 60 332 432         Collection Time Result Time P Axis R Axis T Axis    01/03/22 01:42:13 01/03/22 04:31:47 41 49 66               Final result                 Impression:    Sinus tachycardia  When compared with ECG of 21-Sep-2021 17:41,  No change other than rate  Confirmed by Ventura Bruns ([id number]) on 01/03/2022 4:31:40 AM                              Medical Decision Making:  Angela Bradshaw is a 66 y.o. female who presents with chief complaint as listed above. Based on the history and presentation, the list of differential diagnoses considered included, but was not limited to, pneumothorax, esophageal perforation, gastric ulcer perforation, pneumonia, post-procedural pain    ED Course    66 y.o. female with past medical history of diabetes, hypertension, H. pylori with ulcers who presents with chest, neck pain and headache after endoscopy.  Vital signs within normal limits.  Physical exam concerning for crepitus on the anterior chest and neck concerning for subcutaneous emphysema.  Chest x-ray showed no acute abnormality.  Laboratory work-up was obtained, detailed above, and overall reassuring.  Troponin was negative.  Lactic acid was normal.  No elevated white blood cell count.  EKG showed sinus tachycardia with no ischemic changes.  CT neck and chest showed no evidence of subcutaneous emphysema, pneumothorax, or esophageal perforation.  It did show some patchy bilateral tree-in-bud opacities greatest at the right middle lobe and bilateral lower lobes consistent with aspiration pneumonia.  The patient was given a course of Augmentin and doxycycline.  She was also given a short course of oxycodone.  She was discharged.       Complexity  of Problems Addressed  Patient's active diagnoses as well as contributing pre-existing medical problems include:  Clinical Impression   Pneumonia of both lower lobes due to infectious organism     Evaluation performed for potential threat to life or bodily function during this visit given the initial differential diagnosis and clinical impression(s) as discussed previously in MDM/ED course.    Additional data reviewed:    ? History was obtained from an independent historian: Not in addition to what is mentioned above  ? Prior non-ED notes reviewed: Not in addition to what is mentioned above  ? Independent interpretation of diagnostic tests was performed by me: Not in addition to what is mentioned above  ? Patient presentation/management was discussed with the following qualified health care professionals and/or other relevant professionals: Not in addition to what is mentioned above    Risk evaluation:    ? Diagnosis or treatment of patient condition impacted by social determinant of health: None  ? Tests Considered but not performed due to clinical scoring (if not mentioned in ED course, aside from what is implied by clinical scores listed):    ? Rationale regarding whether admission or escalation of care considered if not performed (if not mentioned in ED course, aside from what is implied by clinical scores listed):      ED Scoring:                                Facility Administered Meds:  Medications   fentaNYL citrate PF (SUBLIMAZE) injection 50 mcg (50 mcg Intravenous Given 01/03/22 0417)   lactated ringers infusion (0 mL Intravenous Infusion Stopped 01/03/22 0625)   ondansetron HCL (PF) (ZOFRAN (PF)) injection 4 mg (4 mg Intravenous Given 01/03/22 0417)   iohexoL (OMNIPAQUE-350) 350 mg/mL injection 145 mL (145 mL Intravenous Given 01/03/22 0655)   sodium chloride PF 0.9% injection 50 mL (50 mL Intravenous Given 01/03/22 0655)   fentaNYL citrate PF (SUBLIMAZE) injection 50 mcg (50 mcg Intravenous Given 01/03/22 0711)       Clinical Impression:  Clinical Impression   Pneumonia of both lower lobes due to infectious organism       Disposition/Follow up  ED Disposition     ED Disposition   Discharge           No follow-up provider specified.    Medications:  Discharge Medication List as of 01/03/2022  9:58 AM      START taking these medications    Details   amoxicillin-potassium clavulanate (AUGMENTIN) 875/125 mg tablet Take one tablet by mouth every 12 hours for 10 days., Disp-20 tablet, R-0, Normal      doxycycline hyclate (VIBRACIN) 100 mg tablet Take one tablet by mouth twice daily., Disp-20 tablet, R-0, Normal             Procedure Notes:  Procedures       Attestation / Supervision:        Bryan Lemma, MD  Emergency Medicine PGY-3

## 2022-01-03 NOTE — ED Notes
66 y.o. female presents to the ED w/ c/o CP. Pt reports mid-sternal CP, anterior HA, and fever since ~1400 yesterday (11/14) after EGD. Pt reports taking tylenol and Norco in the last 12 hours w/out symptom relief. Pt denies similar symptoms in past from other endoscopics or procedures. Pt A&Ox4 resting in bed, respirations even and non-labored. Skin is warm, dry, intact and appropriate for ethnicity. Pt hooked up to monitors. VSS. Bed is in lowest locked position, call light within reach. Belongings remain w/ pt.  Medical History:   Diagnosis Date    Arthritis     DM (diabetes mellitus) (HCC)     Hypertension

## 2022-01-03 NOTE — ED Notes
Discharge instructions provided, prescription sent to pharmacy. Pt and pt's family member verbalized understanding, no further questions/concerns. PIV removed, catheter intact. Pt is AOx4, ABCs intact. Pt ambulated out of ED w/ steady gait. Pt left with belongings.

## 2022-03-26 ENCOUNTER — Encounter: Admit: 2022-03-26 | Discharge: 2022-03-26 | Payer: Medicaid Other

## 2022-05-08 ENCOUNTER — Ambulatory Visit: Admit: 2022-05-08 | Discharge: 2022-05-08 | Payer: Medicaid Other

## 2022-05-08 ENCOUNTER — Encounter: Admit: 2022-05-08 | Discharge: 2022-05-08 | Payer: Medicaid Other

## 2022-05-08 DIAGNOSIS — I10 Essential (primary) hypertension: Secondary | ICD-10-CM

## 2022-05-08 DIAGNOSIS — E113591 Type 2 diabetes mellitus with proliferative diabetic retinopathy without macular edema, right eye: Secondary | ICD-10-CM

## 2022-05-08 DIAGNOSIS — E113592 Type 2 diabetes mellitus with proliferative diabetic retinopathy without macular edema, left eye: Secondary | ICD-10-CM

## 2022-05-08 DIAGNOSIS — M1990 Unspecified osteoarthritis, unspecified site: Secondary | ICD-10-CM

## 2022-05-08 DIAGNOSIS — E119 Type 2 diabetes mellitus without complications: Secondary | ICD-10-CM

## 2022-05-08 MED ORDER — PHENYLEPHRINE HCL 2.5 % OP DROP
1 [drp] | OPHTHALMIC | 0 refills | Status: CN
Start: 2022-05-08 — End: ?

## 2022-05-08 MED ORDER — TETRACAINE HCL (PF) 0.5 % OP DROP
0 refills | Status: DC
Start: 2022-05-08 — End: 2022-05-08

## 2022-05-08 MED ORDER — BEVACIZUMAB 1.25MG/0.05ML INTRAVIT SYR
1.25 mg | Freq: Once | 0 refills | Status: CP | PRN
Start: 2022-05-08 — End: ?

## 2022-05-08 MED ORDER — CYCLOPENTOLATE 1 % OP DROP
1 [drp] | OPHTHALMIC | 0 refills | Status: CN
Start: 2022-05-08 — End: ?

## 2022-05-08 MED ORDER — CYCLOPENTOLATE 1 % OP DROP
1 [drp] | OPHTHALMIC | 0 refills | Status: DC
Start: 2022-05-08 — End: 2022-05-08
  Administered 2022-05-08: 21:00:00 1 [drp] via OPHTHALMIC

## 2022-05-08 MED ORDER — TROPICAMIDE 1 % OP DROP
1 [drp] | OPHTHALMIC | 0 refills | Status: DC
Start: 2022-05-08 — End: 2022-05-08
  Administered 2022-05-08: 21:00:00 1 [drp] via OPHTHALMIC

## 2022-05-08 MED ORDER — PHENYLEPHRINE HCL 2.5 % OP DROP
1 [drp] | OPHTHALMIC | 0 refills | Status: DC
Start: 2022-05-08 — End: 2022-05-08
  Administered 2022-05-08: 21:00:00 1 [drp] via OPHTHALMIC

## 2022-05-08 MED ORDER — TETRACAINE HCL (PF) 0.5 % OP DROP
1 [drp] | OPHTHALMIC | 0 refills | Status: CN
Start: 2022-05-08 — End: ?

## 2022-05-08 MED ORDER — TROPICAMIDE 1 % OP DROP
1 [drp] | OPHTHALMIC | 0 refills | Status: CN
Start: 2022-05-08 — End: ?

## 2022-05-08 MED ORDER — TETRACAINE HCL (PF) 0.5 % OP DROP
1 [drp] | OPHTHALMIC | 0 refills | Status: DC
Start: 2022-05-08 — End: 2022-05-08
  Administered 2022-05-08: 21:00:00 1 [drp] via OPHTHALMIC

## 2022-05-08 NOTE — H&P (View-Only)
Ophthalmology Preoperative History and Physical Exam - @MYYEAR @    CC/Reason for Surgery: PDR OD    HPI:   PDR OU  Vit hge OS  PCIOL OU  Dry eye OU    Past Medical History:  Medical History:   Diagnosis Date    Arthritis     DM (diabetes mellitus) (HCC)     Hypertension         Past Surgical History:  Surgical History:   Procedure Laterality Date    TREATMENT EXTENSIVE/ RETINOPATHY - PHOTOCOAGULATION Left 02/21/2018    Performed by Ronnald Collum, MD at [facility] OR    TREATMENT EXTENSIVE/ RETINOPATHY - PHOTOCOAGULATION Left 03/14/2018    Performed by Ronnald Collum, MD at [facility] OR    TREATMENT EXTENSIVE/ RETINOPATHY - PHOTOCOAGULATION Left 09/16/2020    Performed by Anise Salvo, MD at [facility] OR    LASER DISCISSION SECONDARY MEMBRANOUS CATARACT Right 12/08/2020    Performed by Gwendolyn Grant, MD at [facility] OR    TREATMENT EXTENSIVE/ RETINOPATHY - PHOTOCOAGULATION Right 02/06/2021    Performed by Anise Salvo, MD at [facility] OR    TREATMENT EXTENSIVE/ RETINOPATHY - PHOTOCOAGULATION Left 11/30/2021    Performed by Anise Salvo, MD at [facility] OR    HX APPENDECTOMY          Past Ocular History:  See above    Allergies:  No Known Allergies     Social History:  Social History     Socioeconomic History    Marital status: Married   Tobacco Use    Smoking status: Never    Smokeless tobacco: Never   Vaping Use    Vaping status: Never Used   Substance and Sexual Activity    Alcohol use: Never    Drug use: Never        Medications:    Current Facility-Administered Medications:     cyclopentolate (CYCLOGYL) 1 % ophthalmic solution 1 drop, 1 drop, Right Eye, As Prescribed, Azan Maneri S, MD, 1 drop at 05/08/22 1601    phenylephrine (MYDFRIN) 2.5 % ophthalmic solution 1 drop, 1 drop, Right Eye, As Prescribed, Ysela Hettinger S, MD, 1 drop at 05/08/22 1600    tetracaine HCL PF (PONTOCAINE) 0.5 % ophthalmic solution 1 drop, 1 drop, Right Eye, As Prescribed, Sagar Tengan S, MD, 1 drop at 05/08/22 1559    tropicamide (MYDRIACYL) 1 % ophthalmic solution 1 drop, 1 drop, Right Eye, As Prescribed, Lexianna Weinrich S, MD, 1 drop at 05/08/22 1601    Facility-Administered Medications Ordered in Other Encounters:     bevacizumab (AVASTIN) intravitreal injection 1.25 mg, 1.25 mg, , Once, Leonilda Cozby S, MD    bevacizumab (AVASTIN) intravitreal injection 1.25 mg, 1.25 mg, , Once, Vivica Dobosz S, MD     Family History:  Family History   Family history unknown: Yes        ROS:   Constitutional: WNL   Eyes: See HPI   Ears: WNL   CV: WNL   Resp: WNL   Gastro: WNL   Musculo: WNL   Skin: WNL   Neuro: WNL     Physical Exam:  See nursing intake for vitals    General: No acute distress  HEENT: NC/AT  CV: RRR.  No murmurs/rubs/gallops detected  Resp: CTA Bilaterally  Musculoskeletal: WNL, able to lay flat    Lab Results:  CBC w/Diff    Lab Results   Component Value Date/Time    WBC 10.5 01/03/2022 04:16 AM  RBC 3.33 (L) 01/03/2022 04:16 AM    HGB 10.0 (L) 01/03/2022 04:16 AM    HCT 29.6 (L) 01/03/2022 04:16 AM    MCV 89.0 01/03/2022 04:16 AM    MCH 29.9 01/03/2022 04:16 AM    MCHC 33.6 01/03/2022 04:16 AM    RDW 14.3 01/03/2022 04:16 AM    PLTCT 158 01/03/2022 04:16 AM    MPV 9.5 01/03/2022 04:16 AM    Lab Results   Component Value Date/Time    NEUT 83 (H) 01/03/2022 04:16 AM    ANC 8.67 (H) 01/03/2022 04:16 AM    LYMA 10 (L) 01/03/2022 04:16 AM    ALC 1.07 01/03/2022 04:16 AM    MONA 7 01/03/2022 04:16 AM    AMC 0.74 01/03/2022 04:16 AM    EOSA 0 01/03/2022 04:16 AM    AEC 0.02 01/03/2022 04:16 AM    BASA 0 01/03/2022 04:16 AM    ABC 0.04 01/03/2022 04:16 AM          Covid-19 Testing:  No results found for: COV1, COV2       Assessment:  1: PDR OD      Plan: To laser room for fill in PRP OD    An extensive discussion took place with the patient concerning the risks, benefits and alternatives to the above procedure. The patient was given the opportunity to have all questions answered. At the conclusion of our discussion, signed informed consent was obtained.     Ronnald Collum, MBBCh   Retina and Vitreous Surgery  Department of Ophthalmology  [facility]      [facility]  [address] [place], [place] [address]  Ph:  [phone]

## 2022-05-08 NOTE — Operative Report(Direct Entry)
OPERATIVE REPORT    Name: Angela Bradshaw is a 67 y.o. female     DOB: Aug 27, 1955             MRN#: [id number]    DATE OF OPERATION: 05/08/2022    Surgeons and Role:     * Gay Filler, MD - Primary        Preoperative Diagnosis:    Proliferative diabetic retinopathy of right eye associated with type 2 diabetes mellitus     Post-op Diagnosis      * Proliferative diabetic retinopathy of right eye associated with type 2 diabetes mellitus     Procedure(s) (LRB):  TREATMENT EXTENSIVE/ RETINOPATHY - PHOTOCOAGULATION (Right)    Drops used: phenylephrine, tropicamide, cyclopentolate   Laser Used: Iridex IQ -577nm-yellow laser  Power/Energy: 300  Number of Spots: 346  Duration: 100  Interval: 150  Lens used: volk 28D  Delivery System: LIO        Estimated Blood Loss:  No blood loss documented.     Specimen(s) Removed/Disposition: * No specimens in log *    Attestation: I performed this procedure without the involvement of a resident.    Complications:  None      Implants: * No implants in log *    Drains: Details on drains available on LDA report    Disposition:  PACU - stable    Argel Pablo Estill Bakes, MBBCh  Pager

## 2022-05-10 ENCOUNTER — Encounter: Admit: 2022-05-10 | Discharge: 2022-05-10 | Payer: Medicaid Other

## 2022-05-10 DIAGNOSIS — E119 Type 2 diabetes mellitus without complications: Secondary | ICD-10-CM

## 2022-05-10 DIAGNOSIS — I10 Essential (primary) hypertension: Secondary | ICD-10-CM

## 2022-05-10 DIAGNOSIS — M1990 Unspecified osteoarthritis, unspecified site: Secondary | ICD-10-CM

## 2022-07-05 ENCOUNTER — Encounter: Admit: 2022-07-05 | Discharge: 2022-07-05 | Payer: Medicaid Other

## 2022-07-05 ENCOUNTER — Ambulatory Visit: Admit: 2022-07-05 | Discharge: 2022-07-05 | Payer: Medicaid Other

## 2022-07-05 DIAGNOSIS — E119 Type 2 diabetes mellitus without complications: Secondary | ICD-10-CM

## 2022-07-05 DIAGNOSIS — E785 Hyperlipidemia, unspecified: Secondary | ICD-10-CM

## 2022-07-05 DIAGNOSIS — I10 Essential (primary) hypertension: Secondary | ICD-10-CM

## 2022-07-05 DIAGNOSIS — E113593 Type 2 diabetes mellitus with proliferative diabetic retinopathy without macular edema, bilateral: Secondary | ICD-10-CM

## 2022-07-05 DIAGNOSIS — Z961 Presence of intraocular lens: Secondary | ICD-10-CM

## 2022-07-05 DIAGNOSIS — E113591 Type 2 diabetes mellitus with proliferative diabetic retinopathy without macular edema, right eye: Secondary | ICD-10-CM

## 2022-07-05 DIAGNOSIS — H04129 Dry eye syndrome of unspecified lacrimal gland: Secondary | ICD-10-CM

## 2022-07-05 DIAGNOSIS — M1990 Unspecified osteoarthritis, unspecified site: Secondary | ICD-10-CM

## 2022-07-05 DIAGNOSIS — H35373 Puckering of macula, bilateral: Secondary | ICD-10-CM

## 2022-07-05 MED ORDER — TROPICAMIDE 1 % OP DROP
1 [drp] | OPHTHALMIC | 0 refills | Status: CN
Start: 2022-07-05 — End: ?

## 2022-07-05 MED ORDER — PHENYLEPHRINE HCL 2.5 % OP DROP
1 [drp] | OPHTHALMIC | 0 refills | Status: CN
Start: 2022-07-05 — End: ?

## 2022-07-05 MED ORDER — TROPICAMIDE 1 % OP DROP
1 [drp] | OPHTHALMIC | 0 refills | Status: DC
Start: 2022-07-05 — End: 2022-07-06

## 2022-07-05 MED ORDER — TETRACAINE HCL (PF) 0.5 % OP DROP
1 [drp] | OPHTHALMIC | 0 refills | Status: CN
Start: 2022-07-05 — End: ?

## 2022-07-05 MED ORDER — TETRACAINE HCL (PF) 0.5 % OP DROP
1 [drp] | OPHTHALMIC | 0 refills | Status: DC
Start: 2022-07-05 — End: 2022-07-06

## 2022-07-05 MED ORDER — BEVACIZUMAB 1.25MG/0.05ML INTRAVIT SYR
1.25 mg | Freq: Once | 0 refills | Status: CP | PRN
Start: 2022-07-05 — End: ?

## 2022-07-05 MED ORDER — CYCLOPENTOLATE 1 % OP DROP
1 [drp] | OPHTHALMIC | 0 refills | Status: DC
Start: 2022-07-05 — End: 2022-07-06

## 2022-07-05 MED ORDER — ACETAMINOPHEN 500 MG PO TAB
1000 mg | Freq: Once | ORAL | 0 refills | Status: CP
Start: 2022-07-05 — End: ?
  Administered 2022-07-05: 22:00:00 1000 mg via ORAL

## 2022-07-05 MED ORDER — TETRACAINE HCL (PF) 0.5 % OP DROP
0 refills | Status: DC
Start: 2022-07-05 — End: 2022-07-05

## 2022-07-05 MED ORDER — CYCLOPENTOLATE 1 % OP DROP
1 [drp] | OPHTHALMIC | 0 refills | Status: CN
Start: 2022-07-05 — End: ?

## 2022-07-05 MED ORDER — PHENYLEPHRINE HCL 2.5 % OP DROP
1 [drp] | OPHTHALMIC | 0 refills | Status: DC
Start: 2022-07-05 — End: 2022-07-06

## 2022-07-05 NOTE — H&P (View-Only)
Ophthalmology Preoperative History and Physical Exam - @MYYEAR @    CC/Reason for Surgery: 1: PDR OD  2- retina hemorrhage OD    HPI:   1: PDR OU  2- retina hemorrhage OD  3- PCIOL OU  4- ERM OU  NVS    Past Medical History:  Past Medical History:   Diagnosis Date    Arthritis     DM (diabetes mellitus) (HCC)     Dry eye     Hypertension         Past Surgical History:  Surgical History:   Procedure Laterality Date    TREATMENT EXTENSIVE/ RETINOPATHY - PHOTOCOAGULATION Left 02/21/2018    Performed by Ronnald Collum, MD at [facility] OR    TREATMENT EXTENSIVE/ RETINOPATHY - PHOTOCOAGULATION Left 03/14/2018    Performed by Ronnald Collum, MD at [facility] OR    TREATMENT EXTENSIVE/ RETINOPATHY - PHOTOCOAGULATION Left 09/16/2020    Performed by Anise Salvo, MD at [facility] OR    LASER DISCISSION SECONDARY MEMBRANOUS CATARACT Right 12/08/2020    Performed by Gwendolyn Grant, MD at [facility] OR    TREATMENT EXTENSIVE/ RETINOPATHY - PHOTOCOAGULATION Right 02/06/2021    Performed by Anise Salvo, MD at [facility] OR    TREATMENT EXTENSIVE/ RETINOPATHY - PHOTOCOAGULATION Left 11/30/2021    Performed by Anise Salvo, MD at [facility] OR    TREATMENT EXTENSIVE/ RETINOPATHY - PHOTOCOAGULATION Right 05/08/2022    Performed by Anise Salvo, MD at [facility] OR    HX APPENDECTOMY          Past Ocular History:  See above    Allergies:  No Known Allergies     Social History:  Social History     Socioeconomic History    Marital status: Married   Tobacco Use    Smoking status: Never    Smokeless tobacco: Never   Vaping Use    Vaping status: Never Used   Substance and Sexual Activity    Alcohol use: Never    Drug use: Never    Sexual activity: Not Currently        Medications:  No current facility-administered medications for this encounter.    Facility-Administered Medications Ordered in Other Encounters:     bevacizumab (AVASTIN) intravitreal injection 1.25 mg, 1.25 mg, , Once, Baruc Tugwell S, MD    bevacizumab (AVASTIN) intravitreal injection 1.25 mg, 1.25 mg, , Once, Emyah Roznowski S, MD     Family History:  Family History   Family history unknown: Yes        ROS:   Constitutional: WNL   Eyes: See HPI   Ears: WNL   CV: WNL   Resp: WNL   Gastro: WNL   Musculo: WNL   Skin: WNL   Neuro: WNL     Physical Exam:  See nursing intake for vitals    General: No acute distress  HEENT: NC/AT  CV: RRR.  No murmurs/rubs/gallops detected  Resp: CTA Bilaterally  Musculoskeletal: WNL, able to lay flat    Lab Results:  CBC w/Diff    Lab Results   Component Value Date/Time    WBC 10.5 01/03/2022 04:16 AM    RBC 3.33 (L) 01/03/2022 04:16 AM    HGB 10.0 (L) 01/03/2022 04:16 AM    HCT 29.6 (L) 01/03/2022 04:16 AM    MCV 89.0 01/03/2022 04:16 AM    MCH 29.9 01/03/2022 04:16 AM    MCHC 33.6 01/03/2022 04:16 AM    RDW 14.3 01/03/2022 04:16 AM  PLTCT 158 01/03/2022 04:16 AM    MPV 9.5 01/03/2022 04:16 AM    Lab Results   Component Value Date/Time    NEUT 83 (H) 01/03/2022 04:16 AM    ANC 8.67 (H) 01/03/2022 04:16 AM    LYMA 10 (L) 01/03/2022 04:16 AM    ALC 1.07 01/03/2022 04:16 AM    MONA 7 01/03/2022 04:16 AM    AMC 0.74 01/03/2022 04:16 AM    EOSA 0 01/03/2022 04:16 AM    AEC 0.02 01/03/2022 04:16 AM    BASA 0 01/03/2022 04:16 AM    ABC 0.04 01/03/2022 04:16 AM          Covid-19 Testing:  No results found for: COV1, COV2         Assessment:  1: PDR OD  2- retina hemorrhage OD      Plan: To laser  room for fill in PRP laser OD    An extensive discussion took place with the patient concerning the risks, benefits and alternatives to the above procedure. The patient was given the opportunity to have all questions answered. At the conclusion of our discussion, signed informed consent was obtained.     Ronnald Collum, MBBCh   Retina and Vitreous Surgery  Department of Ophthalmology  [facility]      [facility]  [address] [place], [place] [address]  Ph:  [phone]

## 2022-07-05 NOTE — Operative Report(Direct Entry)
OPERATIVE REPORT    Name: Angela Bradshaw is a 67 y.o. 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     Specimen(s) Removed/Disposition: * No specimens in log *    Attestation: I performed this procedure without the involvement of a resident.    Complications:  None      Implants: * No implants in log *    Drains: Details on drains available on LDA report    Disposition:  PACU - stable          Bodee Lafoe Eddie North, MBBCh  Pager

## 2022-07-05 NOTE — Care Plan
Reviewed poc and d/c instructions w/ daughter and pt. Verbalized understanding.

## 2022-07-06 ENCOUNTER — Encounter: Admit: 2022-07-06 | Discharge: 2022-07-06 | Payer: Medicaid Other

## 2022-07-06 NOTE — Telephone Encounter
Pt c/o OD has been burning and red since the laser and injection yesterday.  Advised pt to use PF artificial tears Q1H in OD and gel gtts at night.  Call office if symptoms persist, decrease in vision or throbbing pain in OD.

## 2022-07-07 ENCOUNTER — Encounter: Admit: 2022-07-07 | Discharge: 2022-07-07 | Payer: Medicaid Other

## 2022-07-07 DIAGNOSIS — E785 Hyperlipidemia, unspecified: Secondary | ICD-10-CM

## 2022-07-07 DIAGNOSIS — E119 Type 2 diabetes mellitus without complications: Secondary | ICD-10-CM

## 2022-07-07 DIAGNOSIS — I10 Essential (primary) hypertension: Secondary | ICD-10-CM

## 2022-07-07 DIAGNOSIS — H04129 Dry eye syndrome of unspecified lacrimal gland: Secondary | ICD-10-CM

## 2022-07-07 DIAGNOSIS — M1990 Unspecified osteoarthritis, unspecified site: Secondary | ICD-10-CM

## 2023-02-07 ENCOUNTER — Encounter: Admit: 2023-02-07 | Discharge: 2023-02-07 | Payer: MEDICARE

## 2023-02-12 ENCOUNTER — Encounter: Admit: 2023-02-12 | Discharge: 2023-02-12 | Payer: Medicaid Other

## 2023-02-12 NOTE — Telephone Encounter
Called pt due to Moderate nonprliferative diabetic retinopathy of right eye with macular edema associated with type 2 diabetes mellitus PHY REF. Pt did not answer and did not have option to LVM.

## 2023-03-14 ENCOUNTER — Encounter: Admit: 2023-03-14 | Discharge: 2023-03-14 | Payer: MEDICARE

## 2023-03-14 ENCOUNTER — Ambulatory Visit: Admit: 2023-03-14 | Discharge: 2023-03-15 | Payer: MEDICARE

## 2023-03-14 DIAGNOSIS — Z961 Presence of intraocular lens: Secondary | ICD-10-CM

## 2023-03-14 DIAGNOSIS — E113513 Type 2 diabetes mellitus with proliferative diabetic retinopathy with macular edema, bilateral: Secondary | ICD-10-CM

## 2023-03-14 DIAGNOSIS — H26492 Other secondary cataract, left eye: Secondary | ICD-10-CM

## 2023-03-14 DIAGNOSIS — H04129 Dry eye syndrome of unspecified lacrimal gland: Secondary | ICD-10-CM

## 2023-03-14 MED ORDER — BEVACIZUMAB 1.25MG/0.05ML INTRAVIT SYR
1.25 mg | Freq: Once | 0 refills | Status: DC | PRN
Start: 2023-03-14 — End: 2023-03-14

## 2023-03-14 MED ORDER — FARICIMAB-SVOA 6 MG/0.05 ML INTRAVITREAL SYRG
6 mg | Freq: Once | INTRAVITREAL | 0 refills | Status: CP | PRN
Start: 2023-03-14 — End: ?

## 2023-04-03 ENCOUNTER — Encounter: Admit: 2023-04-03 | Discharge: 2023-04-03 | Payer: MEDICARE

## 2023-04-03 ENCOUNTER — Ambulatory Visit: Admit: 2023-04-03 | Discharge: 2023-04-04 | Payer: MEDICARE

## 2023-04-03 DIAGNOSIS — H26492 Other secondary cataract, left eye: Secondary | ICD-10-CM

## 2023-04-03 NOTE — Progress Notes
Ophthalmology Clinic    Encounter Date: 04/03/2023    Subjective:    Angela Bradshaw is a 69 y.o. female .  Subjective     HPI       Eye Exam     Additional comments: Patient referred by Dr. Elpidio Anis for YAG Eval. She states her vision has improved after injections. Patient complains of glare from headlights/ street lights as a passenger, as she doesn't drive at nighttime due to this.           Last edited by Camelia Phenes on 04/03/2023  9:11 AM.              The patient presents with vision changes and consideration of laser treatment. She is accompanied by her mother. She was referred by Dr. Elpidio Anis for evaluation of scar tissue in the left eye.    She experiences vision changes, particularly shadows in her left eye, despite slight improvement after recent eye injections. Her vision is affected by scar tissue in the left eye.    She received an injection in the right eye previously and underwent cataract surgery over five years ago, possibly nine years ago, before the COVID-19 pandemic.    Her vision is also limited by diabetic changes in the back of the eye, contributing to her overall visual impairment.                Objective  Base Eye Exam       Visual Acuity (Snellen - Linear)         Right Left    Dist sc 20/80 +1 20/70 -2    Dist ph sc NI       Correction: Glasses              Tonometry (Tonopen, 9:28 AM)         Right Left    Pressure 15 18              Pupils         Pupils Shape APD    Right PERRL Round None    Left PERRL Round None              Visual Fields         Left Right     Full Full              Extraocular Movement         Right Left     Full, Ortho Full, Ortho              Neuro/Psych       Oriented x3: Yes    Mood/Affect: Normal              Dilation       Both eyes: 1.0% Tropicamide, 2.5% Phenylephrine @ 9:28 AM                  Slit Lamp and Fundus Exam       External Exam         Right Left    External Normal Normal              Slit Lamp Exam         Right Left    Lids/Lashes Normal Normal Conjunctiva/Sclera White and quiet White and quiet    Cornea few PEE PEE    Anterior Chamber Deep and quiet Deep and quiet    Iris Flat Flat    Lens PCIOL  open PC PCIOL with 3-4+ PCO    Vitreous Normal subhyaloid vit hemorrhage                   Refraction       Manifest Refraction         Sphere Cylinder Axis Dist VA    Right +1.00 Sphere  20/50    Left [place] +0.25 105 20/60+2                         Problem   Pco (Posterior Capsular Opacification), Left   Pco (Posterior Capsular Opacification), Right (Resolved)                Posterior Capsule Opacification (Left Eye)  Noted significant scar tissue likely affecting vision. Patient reports seeing shadows. Previous cataract surgery performed years ago.  -Plan for YAG laser capsulotomy. Discussed procedure details, location, and risks (less than 1% complication rate).    Diabetic Retinopathy  Vision limitation due to diabetes. Recent improvement in vision following injections.  -Continue current management plan.    Follow-up  Surgery scheduler to contact patient's daughter to arrange for YAG laser capsulotomy.                    Donata Clay, MD  Staff Ophthalmologist

## 2023-04-15 ENCOUNTER — Encounter: Admit: 2023-04-15 | Discharge: 2023-04-15 | Payer: MEDICARE

## 2023-04-15 ENCOUNTER — Ambulatory Visit: Admit: 2023-04-15 | Discharge: 2023-04-15 | Payer: MEDICARE

## 2023-04-15 ENCOUNTER — Ambulatory Visit: Admit: 2023-04-15 | Discharge: 2023-04-16 | Payer: MEDICARE

## 2023-04-15 DIAGNOSIS — H35373 Puckering of macula, bilateral: Secondary | ICD-10-CM

## 2023-04-15 DIAGNOSIS — E113513 Type 2 diabetes mellitus with proliferative diabetic retinopathy with macular edema, bilateral: Secondary | ICD-10-CM

## 2023-04-15 MED ORDER — FARICIMAB-SVOA 6 MG/0.05 ML INTRAVITREAL SYRG
6 mg | Freq: Once | INTRAVITREAL | 0 refills | Status: CP | PRN
Start: 2023-04-15 — End: ?

## 2023-04-15 NOTE — Progress Notes
 Body mass index is 21.95 kg/m?Marland Kitchen      INTRAVITREAL INJECTION, OU-BOTH EYES          Time Out  Confirmed correct patient, procedure, site, and patient consented.     Anesthesia  Right Eye  Xylocaine 2%, Proparacaine 0.5% was given via the subconjunctival route for anesthesia.     Left Eye  Xylocaine 2%, Proparacaine 0.5% was given via the subconjunctival route for anesthesia.     Procedure  Right Eye  Preparation included 5% betadine to ocular surface, eyelid speculum. A 30 gauge needle was used. The following medication was patient supplied.     Injection:  6 mg faricimab-svoa syringe 6 mg/0.05 mL    Route: INTRAVITREAL, Site: Eye, Right    NDC: R2083049, Lot: [id number], Expiration date: 06/18/2024     Left Eye  Preparation included 5% betadine to ocular surface, eyelid speculum. A 30 gauge needle was used. The following medication was patient supplied.     Injection:  6 mg faricimab-svoa syringe 6 mg/0.05 mL    Route: INTRAVITREAL, Site: Eye, Left    NDC: R2083049, Lot: [id number], Expiration date: 06/18/2024     Post-op  Post injection/procedure medications include Artificial Tears.             OCT macula  OD: ERM,  DME   OS: ERM,  DME           Assessment and Plan:           PMH:  DM2 for 30+ years, taking metformin and another oral medication, no insulin,   Joint problem?  HLD     Medications: Gabapentin, metformin, atorvastatin, aspirin     Patient reports not being abel to sleep which is causing her headaches, plans ot see her pcp on Monday  (advised to call sooner if possoilbe)      1. PDR OU with dme ou  S/p prp OS  Stress importance of glycemic and systemic control.  Plan fill in prp OD today      ivV OU     2. PCIOL OU    Follow-up with Dr. Nino Parsley     3- ERM OU  NVS  monitor     4-  Dry eye syndrome OU  AT and warm compresses     Signs and symptoms of retinal tears and retinal detachment were reviewed in details with the patient. Patient was instructed to immediately present for evaluation or seek medical help if increased flashes, increased floaters, any decrease in vision, or curtain in field of vision.     F/u 58m or sooner prn  Oct, optos      Paient daughter was present

## 2023-04-17 ENCOUNTER — Encounter: Admit: 2023-04-17 | Discharge: 2023-04-17 | Payer: MEDICARE

## 2023-04-18 ENCOUNTER — Encounter: Admit: 2023-04-18 | Discharge: 2023-04-18 | Payer: MEDICARE

## 2023-04-18 ENCOUNTER — Ambulatory Visit: Admit: 2023-04-18 | Discharge: 2023-04-18 | Payer: MEDICARE

## 2023-04-20 ENCOUNTER — Encounter: Admit: 2023-04-20 | Discharge: 2023-04-20 | Payer: MEDICARE

## 2023-05-08 ENCOUNTER — Encounter: Admit: 2023-05-08 | Discharge: 2023-05-08 | Payer: MEDICARE

## 2023-05-09 ENCOUNTER — Encounter: Admit: 2023-05-09 | Discharge: 2023-05-09 | Payer: MEDICARE

## 2023-05-10 ENCOUNTER — Ambulatory Visit: Admit: 2023-05-10 | Discharge: 2023-05-11 | Payer: MEDICARE

## 2023-05-10 ENCOUNTER — Encounter: Admit: 2023-05-10 | Discharge: 2023-05-10 | Payer: MEDICARE

## 2023-05-10 DIAGNOSIS — H26492 Other secondary cataract, left eye: Secondary | ICD-10-CM

## 2023-05-10 NOTE — Progress Notes
 Assessment and Plan:           PCO (posterior capsular opacification), left  PDR follows with Dr. Elpidio Anis, now s/p PRP OD since yesterday  PO 1 month YPC OS  Doing well, patient notes improved VA  Limited by PDR  No follow-up appt noted with Dr. Elpidio Anis, patient unsure of follow-up interval.     Follow-up Dr. Elpidio Anis s/p PRP OD         [place] Jensen, MD PGY4    ATTESTATION    I personally performed the key portions of the E/M visit, discussed case with resident and concur with resident documentation of history, physical exam, assessment, and treatment plan unless otherwise noted.    Staff name:  Gwendolyn Grant, MD   Date:  05/10/2023          HPI:  Patient presents with:  Post-op: 3 wk po OS/ S/p PRP OD - Its good after her procedure. She feels that her vision  is a little better. OD is a little dark after her retinal laser yesterday OD. Denies any pain. She uses dark glasses due to bright lights being bothersome. Vision is a little clearer OS after PCO laser.         Exam:  Base Eye Exam       Visual Acuity (Snellen - Linear)         Right Left    Dist sc 20/60 +1 20/50 -2              Tonometry (iCare Tonometer, 2:50 PM)         Right Left    Pressure 15 21              Pupils         Dark Shape React    Right 8 dilated Round     Left 4 Round Brisk              Neuro/Psych       Oriented x3: Yes    Mood/Affect: Normal                  Slit Lamp and Fundus Exam       External Exam         Right Left    External Normal Normal              Slit Lamp Exam         Right Left    Lids/Lashes Normal Normal    Conjunctiva/Sclera White and quiet White and quiet    Cornea debris in TF debris in TF    Anterior Chamber Deep and quiet Deep and quiet    Iris Flat Flat    Lens PCIOL open PC PCIOL, open PC                  Refraction       Manifest Refraction         Sphere Cylinder Dist VA    Right -0.50 Sphere 20/60    Left [place] Sphere 20/50

## 2023-05-10 NOTE — Assessment & Plan Note
 PDR follows with Dr. Elpidio Anis, now s/p PRP OD since yesterday  PO 1 month YPC OS  Doing well, patient notes improved VA  Limited by PDR  No follow-up appt noted with Dr. Elpidio Anis, patient unsure of follow-up interval.     Follow-up Dr. Elpidio Anis s/p PRP OD

## 2023-05-11 ENCOUNTER — Encounter: Admit: 2023-05-11 | Discharge: 2023-05-11 | Payer: MEDICARE

## 2023-05-14 ENCOUNTER — Encounter: Admit: 2023-05-14 | Discharge: 2023-05-14 | Payer: MEDICARE

## 2023-05-14 ENCOUNTER — Ambulatory Visit: Admit: 2023-05-14 | Discharge: 2023-05-15 | Payer: MEDICARE

## 2023-05-14 ENCOUNTER — Ambulatory Visit: Admit: 2023-05-14 | Discharge: 2023-05-14 | Payer: MEDICARE

## 2023-05-14 DIAGNOSIS — H5203 Hypermetropia, bilateral: Secondary | ICD-10-CM

## 2023-05-14 NOTE — Progress Notes
 Body mass index is 29.99 kg/m?Marland Kitchen             Assessment and Plan:  A/ 1. Hyperopic astigmatism with presbyopia OU   -most recent glasses are broken, needs replacement   -has worn BF in the past    P/ 1. Prescription for glasses provided to patient. Continue care with Dr. Elpidio Anis as recommended.

## 2023-12-07 IMAGING — MR COLUNAS LOMBAR
1 series · 9 of 9 positions shown · non-contrast
Comparison: none

[Series 2: localizador · U · 2 acquisitions, 9 frames shown]
[im 1/2]
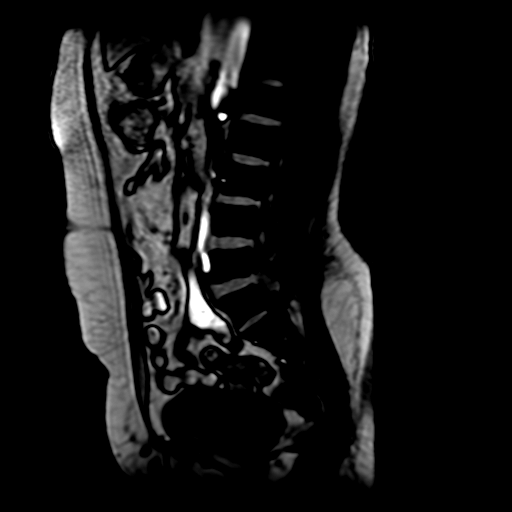
[im 1/2]
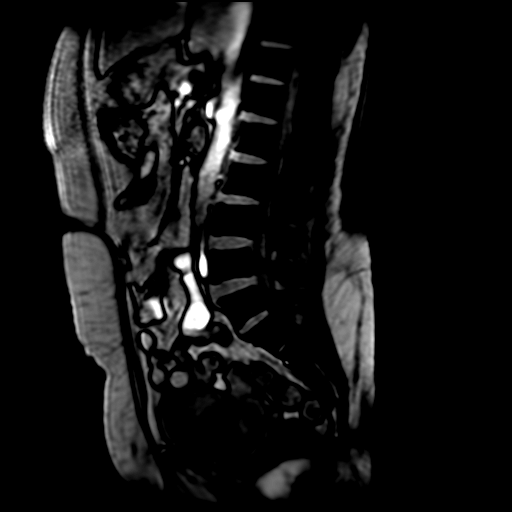
[im 1/2]
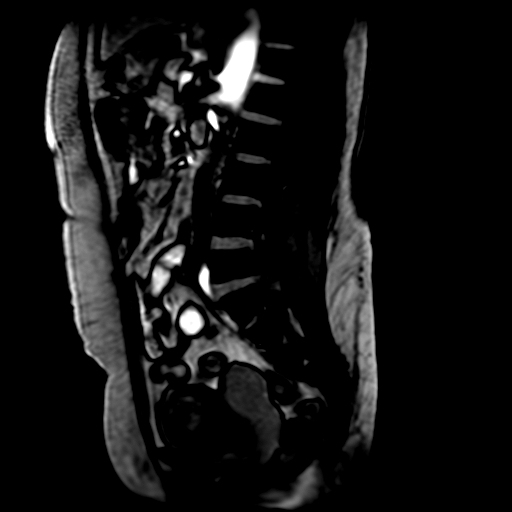
[im 1/2]
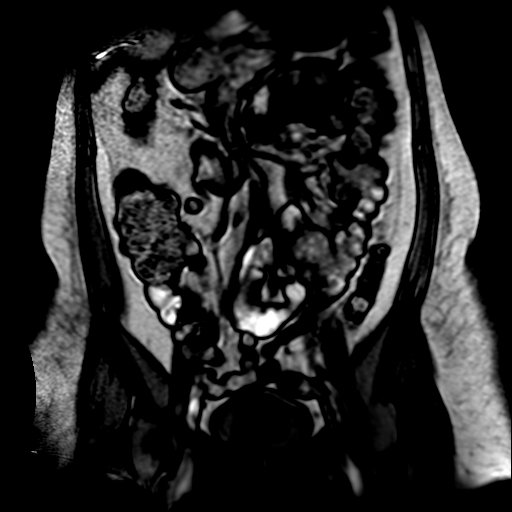
[im 1/2]
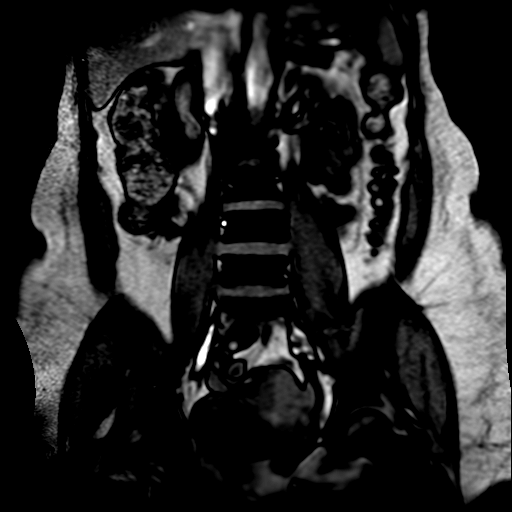
[im 1/2]
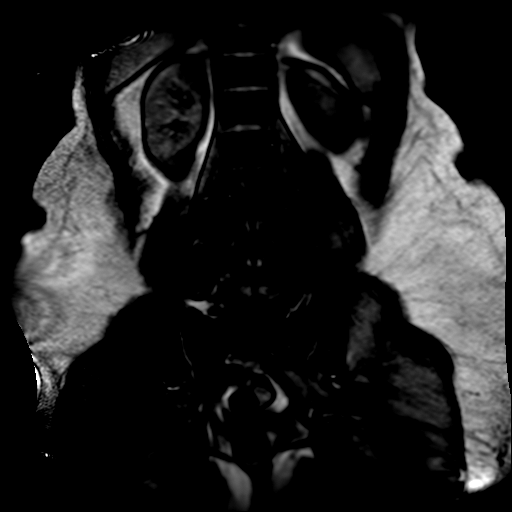
[im 1/2]
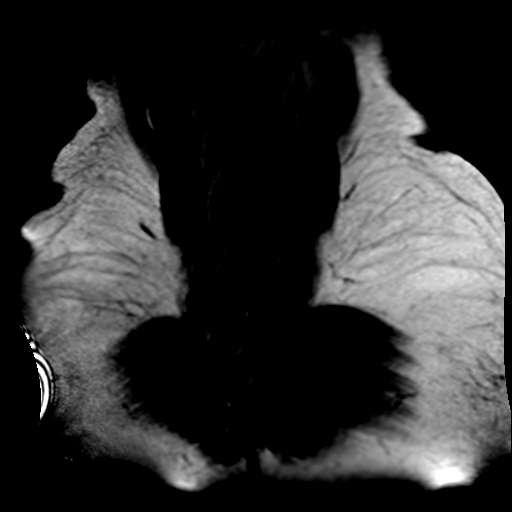
[im 2/2]
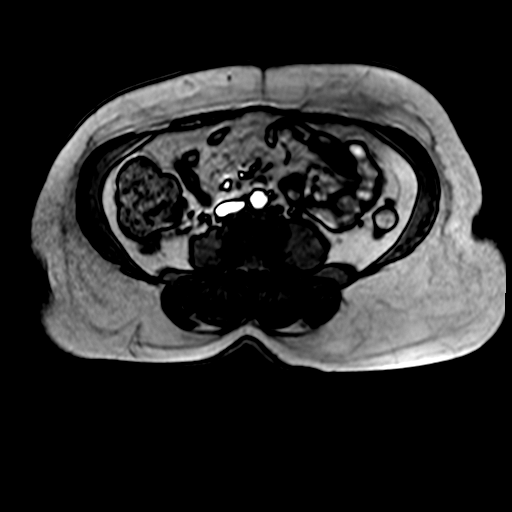
[im 2/2]
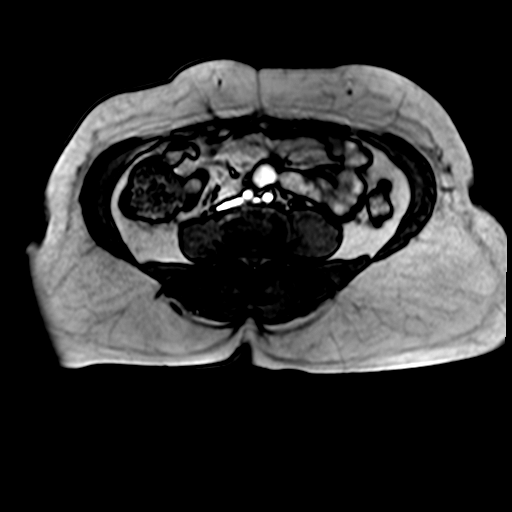

[9 of 9 positions shown; findings below may reference images not displayed]

Médico Solicitante:[facility]
Indicação clínica:
Dor lombar em investigação.
Discopatia compressiva radicular.
Técnica:
Exame realizado em equipamento de ressonância magnética com sequências, ponderações e planos especíﬁcos para o
RESSONÂNCIA MAGNÉTICA DE COLUNA LOMBAR
segmento de interesse, sem a administração endovenosa do meio de contraste.
Relatório:
Escoliose de convexidade à esquerda.
Alinhamento sagital posterior preservado.
Acentuação da lordose ﬁsiológica lombar.
Corpos vertebrais com alturas preservadas, sem fraturas ósseas desalinhadas e com osteóﬁtos somatomarginais de
aspecto degenerativo.
Edema no ligamento interespinhoso sugerindo provável sobrecarga mecânica em L3-L4 a L5-S1.
Desidratação discal caracterizada por redução do sinal discal com redução do espaço intervertebral em L2-L3 a L5-S1.
Abaulamento discal difuso que reduz a amplitude foraminal bilateral, comprime a face ventral do saco dural e as raízes
nervosas emergentes e descendentes bilaterais na correspondência  em L3-L4 e L4-L5.
Médico Solicitante:[facility]
Abaulamento discal assimétrico que comprime a face ventral do saco dural e reduz a amplitude foraminal direita em
L5-S1. Compressão da porção intracanal da raiz descendente direita de S1.
Alterações degenerativas nas articulações interapoﬁsárias inferiores.
Cone medular tópico, com morfologia e características de sinal normais.
Lipossubstituição parcial dos ventres musculares lombosacrais.
Alterações degenerativas nas articulações sacroilíacas.
Espessamento do ligamento amarelo associado às alterações acima descritas que determina estenose discreta do
canal vertebral ao nível de L4-L5.
Edema no subcutâneo.
Impressão:
Discopatia.
Edema no subcutâneo.
Escoliose de convexidade à esquerda.
Edema no ligamento interespinhoso inferindo provável sobrecarga mecânica.
Compressão da porção intracanal da raiz descendente direita de S1.
Estenose com redução discreta na amplitude do canal vertebral.
Alterações pormenorizadas são descritas no corpo do laudo.
